# Patient Record
Sex: Female | Born: 1975 | Race: White | Hispanic: No | Marital: Single | Smoking: Current some day smoker
Health system: Southern US, Community
[De-identification: ages and names within clinical notes are randomized; demographics above are authoritative.]

## PROBLEM LIST (undated history)

## (undated) DIAGNOSIS — J329 Chronic sinusitis, unspecified: Secondary | ICD-10-CM

## (undated) DIAGNOSIS — Z91040 Latex allergy status: Secondary | ICD-10-CM

## (undated) DIAGNOSIS — K449 Diaphragmatic hernia without obstruction or gangrene: Secondary | ICD-10-CM

## (undated) DIAGNOSIS — L259 Unspecified contact dermatitis, unspecified cause: Secondary | ICD-10-CM

## (undated) DIAGNOSIS — K5000 Crohn's disease of small intestine without complications: Secondary | ICD-10-CM

## (undated) DIAGNOSIS — E119 Type 2 diabetes mellitus without complications: Secondary | ICD-10-CM

## (undated) DIAGNOSIS — E669 Obesity, unspecified: Secondary | ICD-10-CM

## (undated) DIAGNOSIS — F17200 Nicotine dependence, unspecified, uncomplicated: Secondary | ICD-10-CM

## (undated) DIAGNOSIS — N83209 Unspecified ovarian cyst, unspecified side: Secondary | ICD-10-CM

## (undated) HISTORY — DX: Nicotine dependence, unspecified, uncomplicated: F17.200

## (undated) HISTORY — PX: TONSILLECTOMY AND ADENOIDECTOMY: SUR1326

## (undated) HISTORY — DX: Diaphragmatic hernia without obstruction or gangrene: K44.9

## (undated) HISTORY — DX: Crohn's disease of small intestine without complications: K50.00

## (undated) HISTORY — DX: Chronic sinusitis, unspecified: J32.9

## (undated) HISTORY — PX: NASAL SEPTUM SURGERY: SHX37

## (undated) HISTORY — DX: Unspecified ovarian cyst, unspecified side: N83.209

## (undated) HISTORY — DX: Latex allergy status: Z91.040

## (undated) HISTORY — DX: Obesity, unspecified: E66.9

## (undated) HISTORY — DX: Type 2 diabetes mellitus without complications: E11.9

## (undated) HISTORY — DX: Unspecified contact dermatitis, unspecified cause: L25.9

---

## 1998-09-22 ENCOUNTER — Emergency Department (HOSPITAL_COMMUNITY): Admission: EM | Admit: 1998-09-22 | Discharge: 1998-09-22 | Payer: Self-pay | Admitting: Emergency Medicine

## 1999-01-11 ENCOUNTER — Emergency Department (HOSPITAL_COMMUNITY): Admission: EM | Admit: 1999-01-11 | Discharge: 1999-01-11 | Payer: Self-pay | Admitting: *Deleted

## 1999-01-16 ENCOUNTER — Encounter: Payer: Self-pay | Admitting: Specialist

## 1999-01-16 ENCOUNTER — Ambulatory Visit (HOSPITAL_COMMUNITY): Admission: RE | Admit: 1999-01-16 | Discharge: 1999-01-16 | Payer: Self-pay | Admitting: Specialist

## 2010-06-12 ENCOUNTER — Emergency Department (HOSPITAL_COMMUNITY)
Admission: EM | Admit: 2010-06-12 | Discharge: 2010-06-13 | Disposition: A | Discharge status: Home / Self Care | Payer: Self-pay | Attending: Emergency Medicine | Admitting: Emergency Medicine

## 2010-06-12 DIAGNOSIS — E119 Type 2 diabetes mellitus without complications: Secondary | ICD-10-CM | POA: Insufficient documentation

## 2010-06-12 DIAGNOSIS — R51 Headache: Secondary | ICD-10-CM | POA: Insufficient documentation

## 2010-06-12 DIAGNOSIS — K5090 Crohn's disease, unspecified, without complications: Secondary | ICD-10-CM | POA: Insufficient documentation

## 2010-06-13 LAB — URINALYSIS, ROUTINE W REFLEX MICROSCOPIC
Bilirubin Urine: NEGATIVE
Ketones, ur: NEGATIVE mg/dL
Nitrite: NEGATIVE
Protein, ur: NEGATIVE mg/dL
Specific Gravity, Urine: 1.039 — ABNORMAL HIGH (ref 1.005–1.030)
Urine Glucose, Fasting: >1000 mg/dL — AB
Urobilinogen, UA: 0.2 mg/dL (ref 0.0–1.0)
pH: 5.5 (ref 5.0–8.0)

## 2010-06-13 LAB — URINE MICROSCOPIC-ADD ON

## 2010-06-13 LAB — BASIC METABOLIC PANEL
BUN: 12 mg/dL (ref 6–23)
Creatinine, Ser: 0.89 mg/dL (ref 0.4–1.2)
GFR calc non Af Amer: >60 mL/min (ref >60)
Glucose, Bld: 472 mg/dL — ABNORMAL HIGH (ref 70–99)
Potassium: 4.1 mEq/L (ref 3.5–5.1)

## 2010-06-13 LAB — POCT PREGNANCY, URINE: Preg Test, Ur: NEGATIVE

## 2010-06-13 LAB — DIFFERENTIAL
Basophils Absolute: 0.1 K/uL (ref 0.0–0.1)
Basophils Relative: 1 % (ref 0–1)
Eosinophils Absolute: 0.2 K/uL (ref 0.0–0.7)
Eosinophils Relative: 2 % (ref 0–5)
Lymphocytes Relative: 43 % (ref 12–46)
Lymphs Abs: 4.2 K/uL — ABNORMAL HIGH (ref 0.7–4.0)
Monocytes Absolute: 0.4 K/uL (ref 0.1–1.0)
Monocytes Relative: 4 % (ref 3–12)
Neutro Abs: 5 K/uL (ref 1.7–7.7)
Neutrophils Relative %: 51 % (ref 43–77)

## 2010-06-13 LAB — CBC
HCT: 39.4 % (ref 36.0–46.0)
MCH: 31 pg (ref 26.0–34.0)
MCV: 87.4 fL (ref 78.0–100.0)
RDW: 12.3 % (ref 11.5–15.5)
WBC: 9.8 10*3/uL (ref 4.0–10.5)

## 2010-06-13 LAB — GLUCOSE, CAPILLARY: Glucose-Capillary: 530 mg/dL — ABNORMAL HIGH (ref 70–99)

## 2010-06-14 ENCOUNTER — Institutional Professional Consult (permissible substitution) (INDEPENDENT_AMBULATORY_CARE_PROVIDER_SITE_OTHER): Payer: Self-pay | Admitting: Pulmonary Disease

## 2010-06-14 ENCOUNTER — Other Ambulatory Visit: Payer: Self-pay

## 2010-06-14 ENCOUNTER — Ambulatory Visit: Payer: Self-pay | Admitting: Pulmonary Disease

## 2010-06-14 ENCOUNTER — Other Ambulatory Visit: Payer: Self-pay | Admitting: Pulmonary Disease

## 2010-06-14 ENCOUNTER — Encounter: Payer: Self-pay | Admitting: Pulmonary Disease

## 2010-06-14 DIAGNOSIS — E1165 Type 2 diabetes mellitus with hyperglycemia: Secondary | ICD-10-CM | POA: Insufficient documentation

## 2010-06-14 DIAGNOSIS — Z91040 Latex allergy status: Secondary | ICD-10-CM | POA: Insufficient documentation

## 2010-06-14 DIAGNOSIS — F17200 Nicotine dependence, unspecified, uncomplicated: Secondary | ICD-10-CM

## 2010-06-14 DIAGNOSIS — E669 Obesity, unspecified: Secondary | ICD-10-CM

## 2010-06-14 DIAGNOSIS — K5000 Crohn's disease of small intestine without complications: Secondary | ICD-10-CM | POA: Insufficient documentation

## 2010-06-14 DIAGNOSIS — T7840XA Allergy, unspecified, initial encounter: Secondary | ICD-10-CM

## 2010-06-14 DIAGNOSIS — E119 Type 2 diabetes mellitus without complications: Secondary | ICD-10-CM

## 2010-06-14 DIAGNOSIS — L259 Unspecified contact dermatitis, unspecified cause: Secondary | ICD-10-CM | POA: Insufficient documentation

## 2010-06-15 ENCOUNTER — Telehealth: Payer: Self-pay | Admitting: Pulmonary Disease

## 2010-06-17 ENCOUNTER — Encounter: Payer: Self-pay | Attending: Pulmonary Disease | Admitting: *Deleted

## 2010-06-17 ENCOUNTER — Telehealth: Payer: Self-pay | Admitting: Pulmonary Disease

## 2010-06-17 ENCOUNTER — Encounter: Payer: Self-pay | Admitting: Pulmonary Disease

## 2010-06-17 DIAGNOSIS — E119 Type 2 diabetes mellitus without complications: Secondary | ICD-10-CM | POA: Insufficient documentation

## 2010-06-17 DIAGNOSIS — Z713 Dietary counseling and surveillance: Secondary | ICD-10-CM | POA: Insufficient documentation

## 2010-06-20 DIAGNOSIS — K449 Diaphragmatic hernia without obstruction or gangrene: Secondary | ICD-10-CM | POA: Insufficient documentation

## 2010-06-20 DIAGNOSIS — N83209 Unspecified ovarian cyst, unspecified side: Secondary | ICD-10-CM | POA: Insufficient documentation

## 2010-06-21 NOTE — Progress Notes (Signed)
Summary: would like lab resutts  Phone Note Call from Patient Call back at Home Phone [phone]   Caller: Patient Call For: [name] Summary of Call: Patient phoned stated that she was seen yesterday and she had labs she would like the results. She can be reached at [phone]. If her mother Adrienne Goodwin answers the phone you can leave the results with her. Initial call taken by: Vedia Coffer,  June 15, 2010 1:27 PM  Follow-up for Phone Call        called and spoke with pt--Adrienne Goodwin--she is aware that SN is out of the office today but will be back in the am--will print off labs and place on SN cart for review---pt is aware that i will call her tomorrow with these results Randell Loop [facility]  June 15, 2010 2:17 PM   Additional Follow-up for Phone Call Additional follow up Details #1::        momtcb for pt---SN called and reviewed lab results ---a1c was 9.9  not as bad as he thought---take the metformin as directed and glimepiride as directed and get on a good low carb diet and work on her weight and she may be able to decrease these meds some.  lmomtcb for pt Randell Loop [facility]  June 15, 2010 2:58 PM     Additional Follow-up for Phone Call Additional follow up Details #2::    pt returned my call---she is aware per SN that  she needs the low carb diet and get her weight down and to take the meds are prescribed---pt voiced her understanding of this and will call for further problems or questions Randell Loop [facility]  June 15, 2010 5:27 PM

## 2010-06-21 NOTE — Progress Notes (Signed)
Summary: would like for Leigh to call her back re:lab work  Phone Note Call from Patient   Caller: Patient Call For: Remona Boom Summary of Call: Patient phoned would like for Leigh to call her back regarding her blood work that was drawn on Tuesday. Patient can be reached at [phone] Initial call taken by: Vedia Coffer,  June 17, 2010 1:11 PM  Follow-up for Phone Call        pt calling asking if her labs had been faxed to her eye doctor.  pt is  requesting that labs be faxed to [facility] fax #--[phone]. labs have been faxed to pts eye doctor per her request Randell Loop [facility]  June 17, 2010 3:02 PM

## 2010-06-30 NOTE — Assessment & Plan Note (Signed)
Summary: elevated BS/ per TD move to 2pm//jrc   CC:  Re-establish medical care....  History of Present Illness: 35 y/o WF, daughter of Deshauna Cayson, here to re-establish medical care after recent ER visit for blurry vision w/ dx of DM established w/ BS= 457... she was last seen in the [facility] office in the 1990s by DrPatterson for Crohn's ileitis & by DrPlotnikov for general medical care... she stopped all medical follow up & gained weight up to 220# range until recently noted polyuria & polydipsia w/ blurred vision that lead her to her eye doctor & fingerstick BS> 400... she went to the [facility] ER 2/19 where BS=472, neg urine ketones, & she was started on GLIMEPIRIDE 2mg  Qam & told to f/u here...  Current Problems:   Hx of SINUSITIS (ICD-473.9) - s/p deviated septum surg 1991 by DrLawrence... she has prev allergy testing by DrESL in 1994/09/09 w/ +reactions to mold & dust...  CIGARETTE SMOKER (ICD-305.1) - we discussed smoking cessation strategies  DIABETES MELLITUS (ICD-250.00) - new DX 2/12> started on DIET/ EXERCISE, GLIMEPIRIDE 2mg /d & METFORMIN 500mg Bid...  ~  labs 2/12 showed BS= 472, A1c= 9.9  OBESITY (ICD-278.00) - we disussed wt reduction 7 [facility] to [facility]...  ~  review of paper chart shows weights 180-200 in late 90's   ~  weight 2/12 = 219#  HIATAL HERNIA (ICD-553.3) - she had EGD by DrPatterson 1999 w/ sm HH, otherw neg...  ~  AbdSonar Sep 08, 1997 showed 2 sm GB polyps, no stones, no inflamm etc...  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 he felt that she prob had IBS and she was given trial Librax...  ~  CT Abd/ Pelvis in 1993-09-08 was neg x for complex right ovarian cyst (referred to DrSSmith).  ~  note: she had a neg colonoscopy 1995 by DrPerry...  Hx of OVARIAN CYST (ICD-620.2) - prev GYN eval by DrSigSmith  Hx of DERMATITIS (ICD-692.9) PERSONAL HISTORY OF  ALLERGY TO LATEX (ICD-V15.07) - sent to [facility] for eval in 1995/09/09.   Preventive Screening-Counseling & Management  Alcohol-Tobacco     Smoking Status: quit     Packs/Day: 0.5     Year Started: 2007     Year Quit: 09/08/08  Allergies (verified): 1)  ! * Latex  Comments:  Nurse/Medical Assistant: The patient's medications and allergies were reviewed with the patient and were updated in the Medication and Allergy Lists.  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 mother pat Ozga--hx of asthma/emphysema father passed away Sep 08, 2009 of MI 1 brother alive age 47  no PMH 1 sister alive age 79--FM, hip replacements and renal insuff  Social History: Reviewed history and no changes required. quit smoking 2010--smoked this time x 2 1/2  yrs   1/2 ppd single--lives with her mother pat Shepheard no children no alcohol use no exercise daily no caffeine use as of yesterday Smoking Status:  quit Packs/Day:  0.5  Review of Systems       The patient  complains of malaise, nasal congestion, dyspnea on exertion, cough, change in bowel habits, gas/bloating, urinary frequency, nocturia, stiffness, dryness, anxiety, polydipsia, polyuria, and hay fever.  The patient denies fever, chills, sweats, anorexia, fatigue, weakness, weight loss, sleep disorder, blurring, diplopia, eye irritation, eye discharge, vision loss, eye pain, photophobia, earache, ear discharge, tinnitus, decreased hearing, nosebleeds, sore throat, hoarseness, chest pain,  palpitations, syncope, orthopnea, PND, peripheral edema, dyspnea at rest, excessive sputum, hemoptysis, wheezing, pleurisy, nausea, vomiting, diarrhea, constipation, abdominal pain, melena, hematochezia, jaundice, indigestion/heartburn, dysphagia, odynophagia, dysuria, hematuria, urinary hesitancy, back pain, joint pain, joint swelling, muscle cramps, muscle weakness, arthritis, sciatica, restless legs, leg pain at night, leg pain with exertion, rash, itching, suspicious lesions, paralysis, paresthesias, seizures, tremors, vertigo, transient blindness, frequent falls, frequent headaches, difficulty walking, depression, memory loss, confusion, cold intolerance, heat intolerance, polyphagia, unusual weight change, abnormal bruising, bleeding, enlarged lymph nodes, urticaria, allergic rash, and recurrent infections.    Vital Signs:  Patient profile:   35 year old female Height:      65 inches Weight:      219 pounds BMI:     36.58 O2 Sat:      97 % on Room air Temp:     98.9 degrees F oral Pulse rate:   97 / minute BP sitting:   116 / 84  (left arm) Cuff size:   regular  Vitals Entered By: Randell Loop CMA (June 14, 2010 1:57 PM)  O2 Sat at Rest %:  97 O2 Flow:  Room air CC: Re-establish medical care... Is Patient Diabetic? Yes Pain Assessment Patient in pain? no      Comments meds updated today with pt   Physical Exam  Additional Exam:  WD, Obese, 35 y/o WF in NAD... she is somewhat anxious... GENERAL:  Alert & oriented; pleasant & cooperative... HEENT:  [place]/AT, EOM-wnl, PERRLA, Fundi-benign, EACs-clear, TMs-wnl, NOSE-clear, THROAT-clear & wnl. NECK:  Supple w/ full ROM; no JVD; normal carotid impulses w/o bruits; no thyromegaly or nodules palpated; no lymphadenopathy. CHEST:  Clear to P & A; without wheezes/ rales/ or rhonchi. HEART:  Regular Rhythm; without murmurs/ rubs/ or gallops. ABDOMEN:  Soft & nontender; normal bowel sounds; no organomegaly or masses detected. EXT:  without deformities or arthritic changes; no varicose veins/ venous insuffic/ or edema. NEURO:  CN's intact; motor testing normal; sensory testing normal; gait normal & balance OK. DERM:  No lesions noted; no rash etc...    MISC. Report  Procedure date:  06/14/2010  Findings:      We reviewed her ER visit note & labs 06/12/10... A1c= 9.9 today...   Impression & Recommendations:  Problem # 1:  DIABETES MELLITUS (ICD-250.00) we discussed DM & it's causes & treatment... REC> start METFORMIN 500mg  Bid & continue Glimep 2mg  Qam... [facility] to [facility]... we discussed diet + exercise... Accucheck machine & strips for home monitoring... Short term f/u to check progress...  Her updated medication list for this problem includes:    Metformin Hcl 500 Mg Tabs (Metformin hcl) .Marland Kitchen... Take 1 tab by mouth two times a day...    Glimepiride 2 Mg Tabs (Glimepiride) .Marland Kitchen... Take 1 tab by mouth once daily in am...  Orders: Nutrition Referral (Nutrition) TLB-A1C / Hgb A1C (Glycohemoglobin) (83036-A1C)  Problem # 2:  CIGARETTE SMOKER (ICD-305.1) Must quit all smoking... discussed smoking cessation options...  Problem # 3:  OBESITY (ICD-278.00) Weight reduction is key...  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  Problem # 5:  Hx of DERMATITIS (ICD-692.9) she is allergic to Latex...  Problem # 6:  OTHER MEDICAL PROBLEMS AS NOTED>>>  Complete Medication List: 1)  Metformin Hcl 500 Mg Tabs (Metformin hcl) .... Take 1 tab by mouth two times a day... 2)  Glimepiride 2 Mg Tabs (Glimepiride) .... Take 1 tab by mouth once daily in am... 3)  Accu-chek Aviva Strp (Glucose blood) .... Check blood sugar daily as directed... 4)  Diflucan 100 Mg Tabs (Fluconazole) .... Take 2 tabs by mouth today, then 1 tab daily til gone...  Patient Instructions: 1)  Today we updated your med list- see below.... 2)  For your DIABETES:   Start a low carb diet> no sugars no sweets no pastas, no breads, & nothing made w/ white flour (pancakes, waffles, buscuits)...  start the METFORMIN one tab twice daily ay breakfast & dinner... continue the GLIMEPIRIDE given in the ER- one tab in AM..Marland Kitchen 3)  We will arrange for a nutrition consult at the [facility]... 4)  Start checking your BS at home> once daily & vary the times that you check (keep a log book of your results)... 5)  Drink plenty of water>  but that's all> no sodas, jucies, etc... 6)  Call for questions.Marland KitchenMarland Kitchen 7)  Please schedule a follow-up appointment in 1 month. Prescriptions: DIFLUCAN 100 MG TABS (FLUCONAZOLE) take 2 tabs by mouth today, then 1 tab daily til gone...  #5 x 1   Entered and Authorized by:   Michele Mcalpine MD   Signed by:   Michele Mcalpine MD on 06/14/2010   Method used:   Print then Give to Patient   RxID:   [id number] ACCU-CHEK AVIVA  STRP (GLUCOSE BLOOD) check blood sugar daily as directed...  #30 x 12   Entered and Authorized by:   Michele Mcalpine MD   Signed by:   Michele Mcalpine MD on 06/14/2010   Method used:   Print then Give to Patient   RxID:   [id number] GLIMEPIRIDE 2 MG TABS (GLIMEPIRIDE) take 1 tab by mouth once daily in am...  #30 x 12   Entered and Authorized by:   Michele Mcalpine MD   Signed by:   Michele Mcalpine MD on 06/14/2010   Method used:   Print then Give to Patient   RxID:   [id number] METFORMIN HCL 500 MG TABS (METFORMIN HCL) take 1 tab by mouth two times a day...  #60 x 12   Entered and Authorized by:   Michele Mcalpine MD   Signed by:   Michele Mcalpine MD on 06/14/2010   Method used:   Print then Give to Patient   RxID:   [id number]

## 2010-07-04 ENCOUNTER — Encounter: Payer: Self-pay | Admitting: Pulmonary Disease

## 2010-07-04 ENCOUNTER — Ambulatory Visit (INDEPENDENT_AMBULATORY_CARE_PROVIDER_SITE_OTHER): Payer: Self-pay | Admitting: Pulmonary Disease

## 2010-07-04 DIAGNOSIS — E119 Type 2 diabetes mellitus without complications: Secondary | ICD-10-CM

## 2010-07-04 DIAGNOSIS — J069 Acute upper respiratory infection, unspecified: Secondary | ICD-10-CM

## 2010-07-04 DIAGNOSIS — E669 Obesity, unspecified: Secondary | ICD-10-CM

## 2010-07-05 NOTE — Letter (Signed)
Summary: [facility]  [facility]   Imported By: Sherian Rein 06/28/2010 12:58:27  _____________________________________________________________________  External Attachment:    Type:   Image     Comment:   External Document

## 2010-07-11 ENCOUNTER — Ambulatory Visit: Payer: Self-pay | Admitting: Pulmonary Disease

## 2010-07-12 NOTE — Assessment & Plan Note (Signed)
Summary: follow up after starting DM meds/la   CC:  follow up after starting on DM meds/sugars have been good this last week.  History of Present Illness: 35 y/o WF, daughter of Jenilyn Magana, here to re-establish medical care after recent ER visit for blurry vision w/ dx of DM established w/ BS= 457... she was last [facility] in the [facility] office in the 1990s by DrPatterson for Crohn's ileitis & by DrPlotnikov for general medical care... she stopped all medical follow up & gained weight up to 220# range until recently noted polyuria & polydipsia w/ blurred vision that lead her to her eye doctor & fingerstick BS> 400... she went to the [facility] ER 2/19 where BS=472, neg urine ketones, & she was started on GLIMEPIRIDE 2mg  Qam & told to f/u here...   ~  July 04, 2010:  2 week ROV recheck> tolerating Metformin two times a day & Glimep 2mg Qam> BS at home= much improved w/ FBS in the 130-140 range (down from 250 range); she's been to the [facility] w/ diet education but so far wt up 1-2#...    She is c/o some head & chest congestion w/ coupy cough, some CP from coughing but no F C S, no sputum, denies SOB etc> rec to take Mucinex 1-2 Bid w/ plenty of water, Rx Hydromet cough syrup, & ZPak for Prn use...    Current Problems:   Hx of SINUSITIS (ICD-473.9) - s/p deviated septum surg 1991 by DrLawrence... she has prev allergy testing by DrESL in 1996 w/ +reactions to mold & dust...  CIGARETTE SMOKER (ICD-305.1) - she quit smoking in 2011 when her father had MI...  DIABETES MELLITUS (ICD-250.00) - new DX 2/12> started on DIET/ EXERCISE, GLIMEPIRIDE 2mg /d & METFORMIN 500mg Bid...  ~  labs 2/12 showed BS= 472, A1c= 9.9  OBESITY (ICD-278.00) - we disussed wt reduction & referral to [facility]...  ~  review of paper chart shows weights 180-200 in late 90's   ~  weight 2/12 = 219# ==> 2 wk f/u recheck wt=221# & we reviewed the diet recommendations.  HIATAL HERNIA (ICD-553.3) - she had EGD by DrPatterson  1999 w/ sm HH, otherw neg...  ~  AbdSonar 1999 showed 2 sm GB polyps, no stones, no inflamm etc...  ?Hx of CROHN'S DISEASE, SMALL INTESTINE (ICD-555.0) - followed by DrPatterson & last colonoscopy was 1999 showing normal terminal ileum w/o signs of inflamm bowel dis... he felt that she prob had IBS and she was given trial Librax...  ~  CT Abd/ Pelvis in 1995 was neg x for complex right ovarian cyst (referred to DrSSmith).  ~  note: she had a neg colonoscopy 1995 by DrPerry...  Hx of OVARIAN CYST (ICD-620.2) - prev GYN eval by DrSigSmith  Hx of DERMATITIS (ICD-692.9) PERSONAL HISTORY OF ALLERGY TO LATEX (ICD-V15.07) - sent to [facility] for eval in 1997.   Preventive Screening-Counseling & Management  Alcohol-Tobacco     Smoking Status: quit     Packs/Day: 0.5     Year Started: 2007     Year Quit: 2010  Allergies: 1)  ! * Latex  Comments:  Nurse/Medical Assistant: The patient's medications and allergies were reviewed with the patient and were updated in the Medication and Allergy Lists.  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 mother pat Merrick--hx of asthma/emphysema father passed away 09-24-2009 of MI 1 brother alive age 6  no PMH 1 sister alive age 81--FM, hip replacements and renal insuff  Social History: Reviewed history from 06/14/2010 and no changes required. quit smoking 2010--smoked this time x 2 1/2  yrs   1/2 ppd single--lives with her mother pat Wyles no children no alcohol use no  exercise daily no caffeine use as of yesterday  Review of Systems      See HPI       The patient complains of hoarseness and prolonged cough.  The patient denies anorexia, fever, weight loss, weight gain, vision loss, decreased hearing, chest pain, syncope, dyspnea on exertion, peripheral edema, headaches, hemoptysis, abdominal pain, melena, hematochezia, severe indigestion/heartburn, hematuria, incontinence, muscle weakness, suspicious skin lesions, transient blindness, difficulty walking, depression, unusual weight change, abnormal bleeding, enlarged lymph nodes, and angioedema.    Vital Signs:  Patient profile:   35 year old female Height:      65 inches Weight:      220.50 pounds BMI:     36.83 O2 Sat:      97 % on Room air Temp:     97.7 degrees F oral Pulse rate:   112 / minute BP sitting:   112 / 70  (right arm) Cuff size:   large  Vitals Entered By: Randell Loop CMA (July 04, 2010 8:57 AM)  O2 Sat at Rest %:  97 O2 Flow:  Room air CC: follow up after starting on DM meds/sugars have been good this last week Is Patient Diabetic? Yes Pain Assessment Patient in pain? no      Comments meds updated today with pt   Physical Exam  Additional Exam:  WD, Obese, 35 y/o WF in NAD... she is somewhat anxious... GENERAL:  Alert & oriented; pleasant & cooperative... HEENT:  [place]/AT, EOM-wnl, PERRLA, Fundi-benign, EACs-clear, TMs-wnl, NOSE-clear, THROAT-clear & wnl. NECK:  Supple w/ full ROM; no JVD; normal carotid impulses w/o bruits; no thyromegaly or nodules palpated; no lymphadenopathy. CHEST:  Croupy cough but chest clear to P & A; without wheezes/ rales/ or rhonchi heard... HEART:  Regular Rhythm; without murmurs/ rubs/ or gallops. ABDOMEN:  Soft & nontender; normal bowel sounds; no organomegaly or masses detected. EXT: without deformities or arthritic changes; no varicose veins/ venous insuffic/ or edema. NEURO:  CN's intact; motor testing normal; sensory testing normal; gait  normal & balance OK. DERM:  No lesions noted; no rash etc...    Impression & Recommendations:  Problem # 1:  UPPER RESPIRATORY INFECTION (ICD-465.9) We discussed Rx w/ Mucinex, Fluids, Hydromet, ZPak... Her updated medication list for this problem includes:    Mucinex 600 Mg Xr12h-tab (Guaifenesin) .Marland Kitchen... Take 2 tabs by mouth two times a day w/ plenty of water...    Hydromet 5-1.5 Mg/21ml Syrp (Hydrocodone-homatropine) .Marland Kitchen... 1 tsp by mouth every 4-6 h as needed for cough...  Problem # 2:  DIABETES MELLITUS (ICD-250.00) Much improved on these 2 meds w/ FBS down to the 130-140 range... continue same. Her updated medication list for this problem includes:    Metformin Hcl 500 Mg Tabs (Metformin hcl) .Marland Kitchen... Take 1 tab by mouth two times a day...    Glimepiride 2 Mg Tabs (Glimepiride) .Marland Kitchen... Take 1 tab by  mouth once daily in am...  Problem # 3:  OBESITY (ICD-278.00) [facility] but hasn't lost any wt yet> we discussed diet/ exercise/ wt reduction...  Complete Medication List: 1)  Metformin Hcl 500 Mg Tabs (Metformin hcl) .... Take 1 tab by mouth two times a day... 2)  Glimepiride 2 Mg Tabs (Glimepiride) .... Take 1 tab by mouth once daily in am... 3)  Accu-chek Aviva Strp (Glucose blood) .... Check blood sugar daily as directed... 4)  Mucinex 600 Mg Xr12h-tab (Guaifenesin) .... Take 2 tabs by mouth two times a day w/ plenty of water... 5)  Hydromet 5-1.5 Mg/62ml Syrp (Hydrocodone-homatropine) .Marland Kitchen.. 1 tsp by mouth every 4-6 h as needed for cough... 6)  Zithromax Z-pak 250 Mg Tabs (Azithromycin) .... Take as directed for infection...  Patient Instructions: 1)  Today we updated your med list- see below.... 2)  We wrote nrew perscriptions for a cough syrup to use as needed & a ZPak for infection... use the OTC MUCINEX or MucinexDM 2 tabs twice daily w/ lots of fluids.Marland KitchenMarland Kitchen 3)  Continue the Metformin & Glimepiride the same... 4)  Let's get on track w/ our diet & exercise program> if  you are following the instructions your weight will be coming down... 5)  Please schedule a follow-up appointment in 1 month, & we will recheck your A1c at that time... Prescriptions: ZITHROMAX Z-PAK 250 MG TABS (AZITHROMYCIN) take as directed for infection...  #1 pack x 2   Entered and Authorized by:   Michele Mcalpine MD   Signed by:   Michele Mcalpine MD on 07/04/2010   Method used:   Print then Give to Patient   RxID:   [id number] HYDROMET 5-1.5 MG/5ML SYRP (HYDROCODONE-HOMATROPINE) 1 tsp by mouth every 4-6 H as needed for cough...  #8 oz x 2   Entered and Authorized by:   Michele Mcalpine MD   Signed by:   Michele Mcalpine MD on 07/04/2010   Method used:   Print then Give to Patient   RxID:   [id number]    Immunization History:  Influenza Immunization History:    Influenza:  never takes (07/04/2010)

## 2010-08-12 ENCOUNTER — Encounter: Payer: Self-pay | Admitting: Pulmonary Disease

## 2010-08-16 ENCOUNTER — Encounter: Payer: Self-pay | Admitting: Pulmonary Disease

## 2010-08-16 ENCOUNTER — Ambulatory Visit (INDEPENDENT_AMBULATORY_CARE_PROVIDER_SITE_OTHER): Payer: Self-pay | Admitting: Pulmonary Disease

## 2010-08-16 DIAGNOSIS — E669 Obesity, unspecified: Secondary | ICD-10-CM

## 2010-08-16 DIAGNOSIS — K5000 Crohn's disease of small intestine without complications: Secondary | ICD-10-CM

## 2010-08-16 DIAGNOSIS — F17200 Nicotine dependence, unspecified, uncomplicated: Secondary | ICD-10-CM

## 2010-08-16 DIAGNOSIS — E119 Type 2 diabetes mellitus without complications: Secondary | ICD-10-CM

## 2010-08-16 MED ORDER — HYDROCODONE-HOMATROPINE 5-1.5 MG/5ML PO SYRP: 5.0000 mL | ORAL_SOLUTION | Freq: Four times a day (QID) | ORAL | Status: DC | PRN

## 2010-08-16 NOTE — Patient Instructions (Signed)
Good job overall on your diet & exercise program...    The goal is to lose 1 lb per week...  Continue your same medications for now & call for any problems...    Let's plan a morning appt in 6-8 weeks w/ FASTING blood work at that time.Marland KitchenMarland Kitchen

## 2010-08-16 NOTE — Progress Notes (Signed)
Subjective:    Patient ID: Adrienne Goodwin, female    DOB: Jun 09, 1975, 35 y.o.   MRN: [id number]  HPI 60 y/o WF, daughter of Adrienne Goodwin, followed for general medical purposes w/ problems as noted below>  ~  June 14, 2010:  here to re-establish medical care after recent ER visit for blurry vision w/ dx of DM established w/ BS= 457... she was last seen in the [place] office in the 1990s by Adrienne Goodwin for Crohn's ileitis & by Adrienne Goodwin for general medical care... she stopped all medical follow up & gained weight up to 220# range until recently noted polyuria & polydipsia w/ blurred vision that lead her to her eye doctor & fingerstick BS> 400... she went to the [facility] ER 2/19 where BS=472, neg urine ketones, & she was started on GLIMEPIRIDE 2mg  Qam & told to f/u here...  ~  July 04, 2010:  3 week ROV recheck> tolerating Metformin two times a day & Glimep 2mg Qam> BS at home= much improved w/ FBS in the 130-140 range (down from 250 range); she's been to the [facility] w/ diet education but so far wt up 1-2#...    She is c/o some head & chest congestion w/ coupy cough, some CP from coughing but no F C S, no sputum, denies SOB etc> rec to take Mucinex 1-2 Bid w/ plenty of water, Rx Hydromet cough syrup, & ZPak for Prn use...  ~  August 16, 2010:  6wk ROV & doing much better> on diet but wt essent unchanged & not really exercising (states she's losing inches, not lbs);  We reviewed diet + exercise prescription & goal of 1 lb wt reduction per week;  Notes FBS in the 110-125 range now, had one hypoglycemic event at 4pm one day w/ BS 37 (we discussed staying away from sweets, use peanut butter or nabs);  We discussed wt watchers, going w/ a buddy, etc...  Plan ROV 6 wks w/ fasting blood work...         Problem List:    Hx of SINUSITIS (ICD-473.9) - s/p deviated septum surg 1991 by Adrienne Goodwin... she has prev allergy testing by Adrienne Goodwin in 1996 w/ +reactions to mold & dust...  CIGARETTE SMOKER  (ICD-305.1) - she quit smoking in 2011 when her father had MI...  DIABETES MELLITUS (ICD-250.00) - new DX 2/12> started on DIET/ EXERCISE, GLIMEPIRIDE 2mg /d & METFORMIN 500mg Bid... ~  labs 2/12 showed BS= 472, A1c= 9.9 ~  4/12:  She notes FBS down to 110-125 range, but hasn't lost any wt & we discussed this...  OBESITY (ICD-278.00) - we disussed wt reduction & referral to [facility]... ~  review of paper chart shows weights 180-200 in late 90's  ~  weight 2/12 = 219# ==> 2 wk f/u recheck wt=221# & we reviewed the diet recommendations. ~  Weight 4/12 = 221# but she notes "I'm losing inches"... Discussed goal of losing 1 lb every week.  HIATAL HERNIA (ICD-553.3) - she had EGD by Adrienne Goodwin 1999 w/ sm HH, otherw neg... ~  AbdSonar 1999 showed 2 sm GB polyps, no stones, no inflamm etc...  ?Hx of CROHN'S DISEASE, SMALL INTESTINE (ICD-555.0) - followed by Adrienne Goodwin & last colonoscopy was 1999 showing normal terminal ileum w/o signs of inflamm bowel dis... he felt that she prob had IBS and she was given trial Librax... ~  CT Abd/ Pelvis in 1995 was neg x for complex right ovarian cyst (referred to Adrienne Goodwin). ~  note: she had a neg  colonoscopy 1995 by Adrienne Goodwin...  Hx of OVARIAN CYST (ICD-620.2) - prev GYN eval by Adrienne Goodwin  Hx of DERMATITIS (ICD-692.9) PERSONAL HISTORY OF ALLERGY TO LATEX (ICD-V15.07) - sent to [facility] for eval in 1997.   Past Surgical History  Procedure Date  . Tonsillectomy and adenoidectomy   . Nasal septum surgery     Outpatient Encounter Prescriptions as of 08/16/2010  Medication Sig Dispense Refill  . glimepiride (AMARYL) 2 MG tablet Take 2 mg by mouth daily before breakfast.        . glucose blood test strip 1 each by Other route as needed. Use as instructed       . guaiFENesin (MUCINEX) 600 MG 12 hr tablet Take 1,200 mg by mouth 2 (two) times daily.        Marland Kitchen HYDROcodone-homatropine (HYCODAN) 5-1.5 MG/5ML syrup Take 5 mLs by mouth every 6 (six) hours as  needed.        . metFORMIN (GLUCOPHAGE) 500 MG tablet Take 500 mg by mouth 2 (two) times daily with a meal.          Allergies  Allergen Reactions  . Latex     REACTION: rash    Review of Systems         See HPI - all other systems neg except as noted... The patient complains of hoarseness and prolonged cough.  The patient denies anorexia, fever, weight loss, weight gain, vision loss, decreased hearing, chest pain, syncope, dyspnea on exertion, peripheral edema, headaches, hemoptysis, abdominal pain, melena, hematochezia, severe indigestion/heartburn, hematuria, incontinence, muscle weakness, suspicious skin lesions, transient blindness, difficulty walking, depression, unusual weight change, abnormal bleeding, enlarged lymph nodes, and angioedema.     Objective:   Physical Exam     WD, Obese, 35 y/o WF in NAD... she is somewhat anxious... GENERAL:  Alert & oriented; pleasant & cooperative... HEENT:  /AT, EOM-wnl, PERRLA, Fundi-benign, EACs-clear, TMs-wnl, NOSE-clear, THROAT-clear & wnl. NECK:  Supple w/ full ROM; no JVD; normal carotid impulses w/o bruits; no thyromegaly or nodules palpated; no lymphadenopathy. CHEST:  Croupy cough but chest clear to P & A; without wheezes/ rales/ or rhonchi heard... HEART:  Regular Rhythm; without murmurs/ rubs/ or gallops. ABDOMEN:  Soft & nontender; normal bowel sounds; no organomegaly or masses detected. EXT: without deformities or arthritic changes; no varicose veins/ venous insuffic/ or edema. NEURO:  CN's intact; motor testing normal; sensory testing normal; gait normal & balance OK. DERM:  No lesions noted; no rash etc...   Assessment & Plan:   DM>  She is improved w/ FBS in the 110-125 range now but hasn't lost any wt & this is crucial to controlling her DM;  We discussed diet + EXERCISE & the goal of losing 1 lb per week;  She will ret in 6 weeks w/ FASTING blood work at that time...  Ex-smoker>  She has remained quit & congratulated on  this accomplishment...  HH>  She denies GERD, reflux, dysphagia, etc...  Crohn's>  Stable w/o abd pain, N V D or blood seen...  Other medical issues as noted.Marland KitchenMarland Kitchen

## 2010-08-28 ENCOUNTER — Encounter: Payer: Self-pay | Admitting: Pulmonary Disease

## 2010-08-31 ENCOUNTER — Other Ambulatory Visit: Payer: Self-pay | Admitting: Pulmonary Disease

## 2010-08-31 ENCOUNTER — Telehealth: Payer: Self-pay | Admitting: Pulmonary Disease

## 2010-08-31 MED ORDER — HYDROCODONE-HOMATROPINE 5-1.5 MG/5ML PO SYRP: 5.0000 mL | ORAL_SOLUTION | Freq: Four times a day (QID) | ORAL | Status: DC | PRN

## 2010-08-31 NOTE — Telephone Encounter (Signed)
Spoke with pt and notified okay to refill hycodan.  Rx was called to pharm.

## 2010-08-31 NOTE — Telephone Encounter (Signed)
Adrienne Goodwin- aka Adrienne Goodwin- called back to state that the "correct pharmacy" that she uses is [facility] on [address]. [place] Ringer

## 2010-08-31 NOTE — Telephone Encounter (Signed)
Pt requesting refill for hycodan syrup. She says her cough is back and non-prod. She had only been off the cough med x 2 days. She denies any sinus drainage or allergy symptoms. Pls advise.

## 2010-08-31 NOTE — Telephone Encounter (Signed)
Ok  For 1 refill of the cough meds.  thanks

## 2010-10-12 ENCOUNTER — Ambulatory Visit (INDEPENDENT_AMBULATORY_CARE_PROVIDER_SITE_OTHER)
Admission: RE | Admit: 2010-10-12 | Discharge: 2010-10-12 | Disposition: A | Discharge status: Home / Self Care | Payer: Self-pay | Source: Ambulatory Visit | Attending: Pulmonary Disease | Admitting: Pulmonary Disease

## 2010-10-12 ENCOUNTER — Telehealth: Payer: Self-pay | Admitting: Pulmonary Disease

## 2010-10-12 ENCOUNTER — Other Ambulatory Visit (INDEPENDENT_AMBULATORY_CARE_PROVIDER_SITE_OTHER): Payer: Self-pay

## 2010-10-12 ENCOUNTER — Ambulatory Visit (INDEPENDENT_AMBULATORY_CARE_PROVIDER_SITE_OTHER): Payer: Self-pay | Admitting: Pulmonary Disease

## 2010-10-12 DIAGNOSIS — K5000 Crohn's disease of small intestine without complications: Secondary | ICD-10-CM

## 2010-10-12 DIAGNOSIS — F17200 Nicotine dependence, unspecified, uncomplicated: Secondary | ICD-10-CM

## 2010-10-12 DIAGNOSIS — E119 Type 2 diabetes mellitus without complications: Secondary | ICD-10-CM

## 2010-10-12 DIAGNOSIS — L259 Unspecified contact dermatitis, unspecified cause: Secondary | ICD-10-CM

## 2010-10-12 DIAGNOSIS — K449 Diaphragmatic hernia without obstruction or gangrene: Secondary | ICD-10-CM

## 2010-10-12 DIAGNOSIS — E669 Obesity, unspecified: Secondary | ICD-10-CM

## 2010-10-12 DIAGNOSIS — Z0000 Encounter for general adult medical examination without abnormal findings: Secondary | ICD-10-CM

## 2010-10-12 DIAGNOSIS — Z91040 Latex allergy status: Secondary | ICD-10-CM

## 2010-10-12 DIAGNOSIS — T7840XA Allergy, unspecified, initial encounter: Secondary | ICD-10-CM

## 2010-10-12 LAB — LIPID PANEL
HDL: 36.9 mg/dL — ABNORMAL LOW (ref >39.00)
Total CHOL/HDL Ratio: 7
Triglycerides: 289 mg/dL — ABNORMAL HIGH (ref 0.0–149.0)

## 2010-10-12 LAB — CBC WITH DIFFERENTIAL/PLATELET
Basophils Absolute: 0 10*3/uL (ref 0.0–0.1)
Eosinophils Absolute: 0.2 10*3/uL (ref 0.0–0.7)
HCT: 41.2 % (ref 36.0–46.0)
Hemoglobin: 14.4 g/dL (ref 12.0–15.0)
Lymphs Abs: 2.9 10*3/uL (ref 0.7–4.0)
MCHC: 34.9 g/dL (ref 30.0–36.0)
Neutro Abs: 5.1 10*3/uL (ref 1.4–7.7)
RDW: 12.9 % (ref 11.5–14.6)

## 2010-10-12 LAB — BASIC METABOLIC PANEL
CO2: 27 mEq/L (ref 19–32)
Calcium: 9.3 mg/dL (ref 8.4–10.5)
GFR: 78.79 mL/min (ref >60.00)
Sodium: 137 mEq/L (ref 135–145)

## 2010-10-12 LAB — HEPATIC FUNCTION PANEL
Alkaline Phosphatase: 61 U/L (ref 39–117)
Bilirubin, Direct: 0.1 mg/dL (ref 0.0–0.3)

## 2010-10-12 NOTE — Telephone Encounter (Signed)
Spoke with Leigh-states she told patient to call this afternoon for results-will show SN and call patient with lab results.

## 2010-10-12 NOTE — Patient Instructions (Signed)
Today we updated your med list in EPIC>     Let's get on track w/ our DIET & EXERCISE program...    The goal is to lose 1 measily pound per week> YOU CAN DO IT!  Today we did your follow up CXR, EKG, & fasting blood work...    Please call the PHONE TREE in a few days for your results...    Dial [id number] & when prompted enter your patient number followed by the # symbol...    Your patient number is:  [id number]  Call for any questions...  Let's plan another follow up visit to check your progress in 3 months.Marland KitchenMarland Kitchen

## 2010-10-12 NOTE — Progress Notes (Signed)
Subjective:    Patient ID: Adrienne Goodwin, female    DOB: 09-22-1975, 35 y.o.   MRN: [id number]  HPI  27 y/o WF, daughter of Adrienne Goodwin, followed for general medical purposes>>  ~  June 14, 2010:  here to re-establish medical care after recent ER visit for blurry vision w/ dx of DM established w/ BS= 457... she was last seen in the [place] office in the 1990s by DrPatterson for Crohn's ileitis & by DrPlotnikov for general medical care... she stopped all medical follow up & gained weight up to 220# range until recently noted polyuria & polydipsia w/ blurred vision that lead her to her eye doctor & fingerstick BS> 400... she went to the [facility] ER 2/19 where BS=472, neg urine ketones, & she was started on GLIMEPIRIDE 2mg  Qam & told to f/u here... We discussed Diet, Exercise, & added Metformin 500mg  Bid.  ~  July 04, 2010:  3 week ROV recheck> tolerating Metformin two times a day & Glimep 2mg Qam> BS at home= much improved w/ FBS in the 130-140 range (down from 250 range); she's been to the [facility] w/ diet education but so far wt up 1-2#...    She is c/o some head & chest congestion w/ coupy cough, some CP from coughing but no F C S, no sputum, denies SOB etc> rec to take Mucinex 1-2 Bid w/ plenty of water, Rx Hydromet cough syrup, & ZPak for Prn use...  ~  August 16, 2010:  6wk ROV & doing much better> on diet but wt essent unchanged & not really exercising (states she's losing inches, not lbs);  We reviewed diet + exercise prescription & goal of 1 lb wt reduction per week;  Notes FBS in the 110-125 range now, had one hypoglycemic event at 4pm one day w/ BS 37 (we discussed staying away from sweets, use peanut butter or nabs);  We discussed wt watchers, going w/ a buddy, etc...  Plan ROV 6 wks w/ fasting blood work...  ~  October 12, 2010:  33mo ROV & she is feeling better but has gained 4# to 226# today ("I watch what I eat"); we reviewed diet & exercise program ("I visit Mom in the hosp") once  again; BS at home are good in the 120 range w/o hypoglycemia> NOTE: she decr the Gluimepiride herself to 1/2 of the 2mg  tab each AM; labs today showed BS=126, A1c=6.3 (OK to leave Glimep at 1mg /d)...  We checked CXR (clear, NAD), EKG (NSR, minNSSTTWA), Labs (see below)...   Problem List:    Hx of SINUSITIS (ICD-473.9) - s/p deviated septum surg 1991 by DrLawrence... she has prev allergy testing by DrESL in 1996 w/ +reactions to mold & dust...  CIGARETTE SMOKER (ICD-305.1) - she quit smoking in 2011 when her father had MI... ~  CXR 6/12 showed clear lungs, NAD... ~  EKG 6/12 showed NSR, minor NSSTTWA...  HYPERLIPIDEMIA>  On Diet (low chol, low fat) to start, if FLP not trending better then we will start meds... ~  FLP 6/12 on diet alone showed TChol 241, TG 289, HDL 37, LDL 166... We decided on diet & exercise to start...  DIABETES MELLITUS (ICD-250.00) - new DX 2/12> started on DIET/ EXERCISE, GLIMEPIRIDE 2mg /d & METFORMIN 500mg Bid... ~  labs 2/12 showed BS= 472, A1c= 9.9 ~  4/12:  She notes FBS down to 110-125 range, but hasn't lost any wt & we discussed this... ~  Labs 6/12 showed BS= 126, A1c= 6.3, Umicroalb= neg.Marland KitchenMarland Kitchen  Great job, now get wt down.  OBESITY (ICD-278.00) - we disussed wt reduction & referral to [facility]... ~  review of paper chart shows weights 180-200 in late 90's  ~  weight 2/12 = 219# ==> 2 wk f/u recheck wt=221# & we reviewed the diet recommendations. ~  Weight 4/12 = 221# but she notes "I'm losing inches"... Discussed goal of losing 1 lb every week. ~  Weight 6/12 = 226# & we discussed need for a RWEAL diet & exercise program.  HIATAL HERNIA (ICD-553.3) - she had EGD by DrPatterson 1999 w/ sm HH, otherw neg... ~  AbdSonar 1999 showed 2 sm GB polyps, no stones, no inflamm etc...  ?Hx of CROHN'S DISEASE, SMALL INTESTINE (ICD-555.0) - followed by DrPatterson & last colonoscopy was 1999 showing normal terminal ileum w/o signs of inflamm bowel dis... he felt that she  prob had IBS and she was given trial Librax... ~  CT Abd/ Pelvis in 1995 was neg x for complex right ovarian cyst (referred to DrSSmith). ~  note: she had a neg colonoscopy 1995 by DrPerry...  BORDERLINE LFTs> likely FATTY LIVER DISEASE> we discussed this condition & the need to lose weight. ~  Labs 6/12 showed SGOT= 40, SGPT= 55  Hx of OVARIAN CYST (ICD-620.2) - prev GYN eval by DrSigSmith  Hx of DERMATITIS (ICD-692.9) PERSONAL HISTORY OF ALLERGY TO LATEX (ICD-V15.07) - sent to [facility] for eval in 1997.   Past Surgical History  Procedure Date  . Tonsillectomy and adenoidectomy   . Nasal septum surgery     Outpatient Encounter Prescriptions as of 10/12/2010  Medication Sig Dispense Refill  . glimepiride (AMARYL) 2 MG tablet Take 2 mg by mouth daily before breakfast.        . glucose blood test strip 1 each by Other route as needed. Use as instructed       . guaiFENesin (MUCINEX) 600 MG 12 hr tablet Take 1,200 mg by mouth 2 (two) times daily.        . metFORMIN (GLUCOPHAGE) 500 MG tablet Take 500 mg by mouth 2 (two) times daily with a meal.        . HYDROcodone-homatropine (HYCODAN) 5-1.5 MG/5ML syrup Take 5 mLs by mouth every 6 (six) hours as needed.  120 mL  0    Allergies  Allergen Reactions  . Latex     REACTION: rash    Review of Systems         See HPI - all other systems neg except as noted... The patient complains of hoarseness and prolonged cough.  The patient denies anorexia, fever, weight loss, weight gain, vision loss, decreased hearing, chest pain, syncope, dyspnea on exertion, peripheral edema, headaches, hemoptysis, abdominal pain, melena, hematochezia, severe indigestion/heartburn, hematuria, incontinence, muscle weakness, suspicious skin lesions, transient blindness, difficulty walking, depression, unusual weight change, abnormal bleeding, enlarged lymph nodes, and angioedema.     Objective:   Physical Exam     WD, Obese, 35 y/o WF in NAD... she is somewhat  anxious... GENERAL:  Alert & oriented; pleasant & cooperative... HEENT:  [place]/AT, EOM-wnl, PERRLA, Fundi-benign, EACs-clear, TMs-wnl, NOSE-clear, THROAT-clear & wnl. NECK:  Supple w/ full ROM; no JVD; normal carotid impulses w/o bruits; no thyromegaly or nodules palpated; no lymphadenopathy. CHEST:  Croupy cough but chest clear to P & A; without wheezes/ rales/ or rhonchi heard... HEART:  Regular Rhythm; without murmurs/ rubs/ or gallops. ABDOMEN:  Soft & nontender; normal bowel sounds; no organomegaly or masses detected. EXT: without  deformities or arthritic changes; no varicose veins/ venous insuffic/ or edema. NEURO:  CN's intact; motor testing normal; sensory testing normal; gait normal & balance OK. DERM:  No lesions noted; no rash etc...   Assessment & Plan:   DM>  She is improved w/ FBS in the 110-125 range now but hasn't lost any wt & this is crucial to controlling her DM;  We discussed diet + EXERCISE & the goal of losing 1 lb per week;  Labs today shows A1c down to 6.3 which is a great job & she is congratulated> same meds, must get wt down!!!  Ex-smoker>  She has remained quit & congratulated on this accomplishment as well...  CHOL>  Her FLP is abn & related to her diet, lack of exercise, etc;  We reviewed need for diet, wt reduction, or start meds soon...  OBESITY>  Everything hinges upon her losing weight...  HH>  She denies GERD, reflux, dysphagia, etc...  Crohn's>  Stable w/o abd pain, N V D or blood seen...  Other medical issues as noted.Marland KitchenMarland Kitchen

## 2010-10-12 NOTE — Telephone Encounter (Signed)
SN will call pt with her lab results.

## 2010-10-23 ENCOUNTER — Encounter: Payer: Self-pay | Admitting: Pulmonary Disease

## 2011-01-12 ENCOUNTER — Encounter: Payer: Self-pay | Admitting: Pulmonary Disease

## 2011-01-12 ENCOUNTER — Other Ambulatory Visit (INDEPENDENT_AMBULATORY_CARE_PROVIDER_SITE_OTHER): Payer: Self-pay

## 2011-01-12 ENCOUNTER — Ambulatory Visit (INDEPENDENT_AMBULATORY_CARE_PROVIDER_SITE_OTHER): Payer: Self-pay | Admitting: Pulmonary Disease

## 2011-01-12 DIAGNOSIS — E669 Obesity, unspecified: Secondary | ICD-10-CM

## 2011-01-12 DIAGNOSIS — K449 Diaphragmatic hernia without obstruction or gangrene: Secondary | ICD-10-CM

## 2011-01-12 DIAGNOSIS — E785 Hyperlipidemia, unspecified: Secondary | ICD-10-CM

## 2011-01-12 DIAGNOSIS — K5000 Crohn's disease of small intestine without complications: Secondary | ICD-10-CM

## 2011-01-12 DIAGNOSIS — E119 Type 2 diabetes mellitus without complications: Secondary | ICD-10-CM

## 2011-01-12 LAB — BASIC METABOLIC PANEL
CO2: 27 mEq/L (ref 19–32)
GFR: 84.32 mL/min (ref >60.00)
Glucose, Bld: 387 mg/dL — ABNORMAL HIGH (ref 70–99)
Potassium: 3.9 mEq/L (ref 3.5–5.1)
Sodium: 132 mEq/L — ABNORMAL LOW (ref 135–145)

## 2011-01-12 NOTE — Patient Instructions (Signed)
Today we updated your med list in epic...  Today we did your follow up fasting blood work...    Please call the PHONE TREE in a few days for your results...    Dial [id number] & when prompted enter your patient number followed by the # symbol...    Your patient number is:  [id number]#  Keep up the good work w/ diet 7 exercise!!!  Let's plan a follow up visit in about 4 month or so.Marland KitchenMarland Kitchen

## 2011-01-12 NOTE — Progress Notes (Signed)
Subjective:    Patient ID: Adrienne Goodwin, female    DOB: May 05, 1975, 35 y.o.   MRN: [id number]  HPI 38 y/o WF, daughter of Alma Mohiuddin, followed for general medical purposes>>  ~  June 14, 2010:  here to re-establish medical care after recent ER visit for blurry vision w/ dx of DM established w/ BS= 457... she was last seen in the [place] office in the 1990s by DrPatterson for Crohn's ileitis & by DrPlotnikov for general medical care... she stopped all medical follow up & gained weight up to 220# range until recently noted polyuria & polydipsia w/ blurred vision that lead her to her eye doctor & fingerstick BS> 400... she went to the [facility] ER 2/19 where BS=472, neg urine ketones, & she was started on GLIMEPIRIDE 2mg  Qam & told to f/u here... We discussed Diet, Exercise, & added Metformin 500mg  Bid.  ~  July 04, 2010:  3 week ROV recheck> tolerating Metformin two times a day & Glimep 2mg Qam> BS at home= much improved w/ FBS in the 130-140 range (down from 250 range); she's been to the [facility] w/ diet education but so far wt up 1-2#...    She is c/o some head & chest congestion w/ coupy cough, some CP from coughing but no F C S, no sputum, denies SOB etc> rec to take Mucinex 1-2 Bid w/ plenty of water, Rx Hydromet cough syrup, & ZPak for Prn use...  ~  August 16, 2010:  6wk ROV & doing much better> on diet but wt essent unchanged & not really exercising (states she's losing inches, not lbs);  We reviewed diet + exercise prescription & goal of 1 lb wt reduction per week;  Notes FBS in the 110-125 range now, had one hypoglycemic event at 4pm one day w/ BS 37 (we discussed staying away from sweets, use peanut butter or nabs);  We discussed wt watchers, going w/ a buddy, etc...  Plan ROV 6 wks w/ fasting blood work...  ~  October 12, 2010:  61mo ROV & she is feeling better but has gained 4# to 226# today ("I watch what I eat"); we reviewed diet & exercise program ("I visit Mom in the hosp") once  again; BS at home are good in the 120 range w/o hypoglycemia> NOTE: she decr the Gluimepiride herself to 1/2 of the 2mg  tab each AM; labs today showed BS=126, A1c=6.3 (OK to leave Glimep at 1mg /d)...  We checked CXR (clear, NAD), EKG (NSR, minNSSTTWA), Labs (see below)...  ~  January 12, 2011:  80mo ROV & she has finally lost some weight down 12# to 214# today on diet & exercise program; states accuchecks at home in the 100-120 range; LABS TODAY w/ BS 387 A1c 10.7 & I called RA on [address]- last filled Metformin #60 & Glimep #30 on 09/19/10!!!    Ex-smoker> she quit in 2011 & has remained quit; CXR 6/12 was clear, NAD...    Hyperlipid> on diet alone for now, prev FLP 6/12 wasn't good & she is committed to diet, exercise, etc...    DM> on Metform500Bid & Glimep2mg -1/2 inAM; labs today very disappointing w/ BS 387, A1c 10.7; Post OV- ?if taking meds regularly- we have a call into her to discuss options...    Obesity> wt down 12# to 214# today she says "just watching what I eat"- low carb & portion control she says...    GI> HH, Hx Crohn's> she is c/o some reflux symptoms, heartburn, etc and  treating self w/ Tums; we discussed further eval but she declines (no insurance) & decided to Rx w/ OMEPRAZOLE 40mg /d taken 30 min before the 1st meal of the day.          Problem List:    Hx of SINUSITIS (ICD-473.9) - s/p deviated septum surg 1991 by DrLawrence... she has prev allergy testing by DrESL in 1996 w/ +reactions to mold & dust...  CIGARETTE SMOKER (ICD-305.1) - she quit smoking in 2011 when her father had MI... ~  CXR 6/12 showed clear lungs, NAD... ~  EKG 6/12 showed NSR, minor NSSTTWA...  HYPERLIPIDEMIA>  On Diet (low chol, low fat) to start, if FLP not trending better then we will start meds... ~  FLP 6/12 on diet alone showed TChol 241, TG 289, HDL 37, LDL 166... We decided on diet & exercise to start...  DIABETES MELLITUS (ICD-250.00) - new DX 2/12> started on DIET/ EXERCISE, GLIMEPIRIDE  2mg  (now down to 1/2 Qam) & METFORMIN 500mg Bid... ~  NEW PT labs 2/12 showed BS= 472, A1c= 9.9.Marland KitchenMarland Kitchen started Metform500Bid & Glimep2mg /d. ~  4/12:  She notes FBS down to 110-125 range, but hasn't lost any wt & we discussed this... ~  Labs 6/12 showed BS= 126, A1c= 6.3, Umicroalb= neg... Great job, now get wt down; she decr the [employer]- 1/2 tab Qam on her own. ~  9/12: Wt down 12# to 214#; states on meds regularly (Metform500Bid & Glim2mg -1/2am), BS=387 & A1c=10.7, Pharm says last refilled 30d supplies 09/19/10!!!  OBESITY (ICD-278.00) - we disussed wt reduction & referral to [facility]... ~  review of paper chart shows weights 180-200 in late 90's  ~  weight 2/12 = 219# ==> 2 wk f/u recheck wt=221# & we reviewed the diet recommendations. ~  Weight 4/12 = 221# but she notes "I'm losing inches"... Discussed goal of losing 1 lb every week. ~  Weight 6/12 = 226# & we discussed need for a REAL diet & exercise program. ~  Weight 9/12 = 214#  HIATAL HERNIA (ICD-553.3) - she had EGD by DrPatterson 1999 w/ sm HH, otherw neg... ~  AbdSonar 1999 showed 2 sm GB polyps, no stones, no inflamm etc...  ?Hx of CROHN'S DISEASE, SMALL INTESTINE (ICD-555.0) - followed by DrPatterson & last colonoscopy was 1999 showing normal terminal ileum w/o signs of inflamm bowel dis... he felt that she prob had IBS and she was given trial Librax... ~  CT Abd/ Pelvis in 1995 was neg x for complex right ovarian cyst (referred to DrSSmith). ~  note: she had a neg colonoscopy 1995 by DrPerry...  BORDERLINE LFTs> likely FATTY LIVER DISEASE> we discussed this condition & the need to lose weight. ~  Labs 6/12 showed SGOT= 40, SGPT= 55  Hx of OVARIAN CYST (ICD-620.2) - prev GYN eval by DrSigSmith  Hx of DERMATITIS (ICD-692.9) PERSONAL HISTORY OF ALLERGY TO LATEX (ICD-V15.07) - sent to [facility] for eval in 1997.   Past Surgical History  Procedure Date  . Tonsillectomy and adenoidectomy   . Nasal septum surgery      Outpatient Encounter Prescriptions as of 01/12/2011  Medication Sig Dispense Refill  . glimepiride (AMARYL) 2 MG tablet Take 1/2 tablet by mouth every morning      . glucose blood test strip 1 each by Other route as needed. Use as instructed       . guaiFENesin (MUCINEX) 600 MG 12 hr tablet Take 1,200 mg by mouth 2 (two) times daily.        Marland Kitchen  metFORMIN (GLUCOPHAGE) 500 MG tablet Take 500 mg by mouth 2 (two) times daily with a meal.        . DISCONTD: HYDROcodone-homatropine (HYCODAN) 5-1.5 MG/5ML syrup Take 5 mLs by mouth every 6 (six) hours as needed.  120 mL  0    Allergies  Allergen Reactions  . Latex     REACTION: rash    Current Medications, Allergies, Past Medical History, Past Surgical History, Family History, and Social History were reviewed in [employer] record.     Review of Systems         See HPI - all other systems neg except as noted... The patient complains of hoarseness and prolonged cough.  The patient denies anorexia, fever, weight loss, weight gain, vision loss, decreased hearing, chest pain, syncope, dyspnea on exertion, peripheral edema, headaches, hemoptysis, abdominal pain, melena, hematochezia, severe indigestion/heartburn, hematuria, incontinence, muscle weakness, suspicious skin lesions, transient blindness, difficulty walking, depression, unusual weight change, abnormal bleeding, enlarged lymph nodes, and angioedema.     Objective:   Physical Exam     WD, Obese, 35 y/o WF in NAD... she is somewhat anxious... GENERAL:  Alert & oriented; pleasant & cooperative... HEENT:  [place]/AT, EOM-wnl, PERRLA, Fundi-benign, EACs-clear, TMs-wnl, NOSE-clear, THROAT-clear & wnl. NECK:  Supple w/ full ROM; no JVD; normal carotid impulses w/o bruits; no thyromegaly or nodules palpated; no lymphadenopathy. CHEST:  Croupy cough but chest clear to P & A; without wheezes/ rales/ or rhonchi heard... HEART:  Regular Rhythm; without murmurs/ rubs/ or  gallops. ABDOMEN:  Soft & nontender; normal bowel sounds; no organomegaly or masses detected. EXT: without deformities or arthritic changes; no varicose veins/ venous insuffic/ or edema. NEURO:  CN's intact; motor testing normal; sensory testing normal; gait normal & balance OK. DERM:  No lesions noted; no rash etc...   Assessment & Plan:   DM>  Finally got wt down some, but labs at odds w/ her report of accuchecks; states she's taking meds regularly but pharm says she last got 34mo supplies 09/19/10; PostOV we have placed a call to her to reconcile this & decide about Rx going forward> get on meds and take regularly vs referral to Endocrine/ DM specialist...   Ex-smoker>  She has remained quit & congratulated on this accomplishment as well...  CHOL>  Her FLP is abn & she will likely need meds for this as well; We reviewed need for diet, wt reduction, or start meds soon...  OBESITY>  Everything hinges upon her losing weight & she needs to be serious about diet & exercise...  HH>  She notes new onset reflux symptoms; she declines GI referral or further eval w/ EGD etc; we discussed Rx w/ OMEP40mg /d & careful f/u...  Crohn's>  Stable w/o abd pain, N V D or blood seen...  Other medical issues as noted.Marland KitchenMarland Kitchen

## 2011-01-13 ENCOUNTER — Encounter: Payer: Self-pay | Admitting: Pulmonary Disease

## 2011-01-16 ENCOUNTER — Other Ambulatory Visit: Payer: Self-pay | Admitting: *Deleted

## 2011-01-16 MED ORDER — METFORMIN HCL 500 MG PO TABS: 500.0000 mg | ORAL_TABLET | Freq: Two times a day (BID) | ORAL | Status: DC

## 2011-01-16 MED ORDER — GLIMEPIRIDE 2 MG PO TABS: 2.0000 mg | ORAL_TABLET | Freq: Every day | ORAL | Status: DC

## 2011-05-10 ENCOUNTER — Encounter: Payer: Self-pay | Admitting: Pulmonary Disease

## 2011-05-10 ENCOUNTER — Other Ambulatory Visit (INDEPENDENT_AMBULATORY_CARE_PROVIDER_SITE_OTHER): Payer: Self-pay

## 2011-05-10 ENCOUNTER — Ambulatory Visit (INDEPENDENT_AMBULATORY_CARE_PROVIDER_SITE_OTHER): Payer: Self-pay | Admitting: Pulmonary Disease

## 2011-05-10 VITALS — BP 120/84 | HR 107 | Temp 98.8°F | Ht 65.0 in | Wt 225.4 lb

## 2011-05-10 DIAGNOSIS — E669 Obesity, unspecified: Secondary | ICD-10-CM

## 2011-05-10 DIAGNOSIS — E119 Type 2 diabetes mellitus without complications: Secondary | ICD-10-CM

## 2011-05-10 DIAGNOSIS — E785 Hyperlipidemia, unspecified: Secondary | ICD-10-CM

## 2011-05-10 DIAGNOSIS — K449 Diaphragmatic hernia without obstruction or gangrene: Secondary | ICD-10-CM

## 2011-05-10 DIAGNOSIS — Z23 Encounter for immunization: Secondary | ICD-10-CM

## 2011-05-10 LAB — HEMOGLOBIN A1C: Hgb A1c MFr Bld: 8.8 % — ABNORMAL HIGH (ref 4.6–6.5)

## 2011-05-10 LAB — BASIC METABOLIC PANEL
BUN: 11 mg/dL (ref 6–23)
Chloride: 101 mEq/L (ref 96–112)
Glucose, Bld: 284 mg/dL — ABNORMAL HIGH (ref 70–99)
Potassium: 4 mEq/L (ref 3.5–5.1)

## 2011-05-10 NOTE — Progress Notes (Signed)
Subjective:    Patient ID: Adrienne Goodwin, female    DOB: 11-Nov-1975, 36 y.o.   MRN: [id number]  HPI 83 y/o WF, daughter of Adrienne Goodwin, followed for general medical purposes>>  ~  June 14, 2010:  here to re-establish medical care after recent ER visit for blurry vision w/ dx of DM established w/ BS= 457... she was last seen in the [place] office in the 1990s by DrPatterson for Crohn's ileitis & by DrPlotnikov for general medical care... she stopped all medical follow up & gained weight up to 220# range until recently noted polyuria & polydipsia w/ blurred vision that lead her to her eye doctor & fingerstick BS> 400... she went to the [facility] ER 2/19 where BS=472, neg urine ketones, & she was started on GLIMEPIRIDE 2mg  Qam & told to f/u here... We discussed Diet, Exercise, & added Metformin 500mg  Bid.  ~  July 04, 2010:  3 week ROV recheck> tolerating Metformin two times a day & Glimep 2mg Qam> BS at home= much improved w/ FBS in the 130-140 range (down from 250 range); she's been to the [facility] w/ diet education but so far wt up 1-2#...    She is c/o some head & chest congestion w/ coupy cough, some CP from coughing but no F C S, no sputum, denies SOB etc> rec to take Mucinex 1-2 Bid w/ plenty of water, Rx Hydromet cough syrup, & ZPak for Prn use...  ~  August 16, 2010:  6wk ROV & doing much better> on diet but wt essent unchanged & not really exercising (states she's losing inches, not lbs);  We reviewed diet + exercise prescription & goal of 1 lb wt reduction per week;  Notes FBS in the 110-125 range now, had one hypoglycemic event at 4pm one day w/ BS 37 (we discussed staying away from sweets, use peanut butter or nabs);  We discussed wt watchers, going w/ a buddy, etc...  Plan ROV 6 wks w/ fasting blood work...  ~  October 12, 2010:  35mo ROV & she is feeling better but has gained 4# to 226# today ("I watch what I eat"); we reviewed diet & exercise program ("I visit Mom in the hosp") once  again; BS at home are good in the 120 range w/o hypoglycemia> NOTE: she decr the Gluimepiride herself to 1/2 of the 2mg  tab each AM; labs today showed BS=126, A1c=6.3 (OK to leave Glimep at 1mg /d)...  We checked CXR (clear, NAD), EKG (NSR, minNSSTTWA), Labs (see below)...  ~  January 12, 2011:  11mo ROV & she has finally lost some weight down 12# to 214# today on diet & exercise program; states accuchecks at home in the 100-120 range; LABS TODAY w/ BS 387 A1c 10.7 & I called RA on [address]- last filled Metformin #60 & Glimep #30 on 09/19/10!!!    We read her the "riot act"> either get on meds regularly & take every day (+diet & exercise) so we can assess efficacy, or start insulin & refer to DM specialist...  ~  May 10, 2011:  79mo ROV & she has gained 11# up to 225#; states she's taking both meds regularly ([facility] refills) but not checking BS at home; requests flu shot today- ok...    Ex-smoker> she quit in 2011 & has remained quit; CXR 6/12 was clear, NAD...    Hyperlipid> on diet alone for now, prev FLP 6/12 wasn't good & she is committed to diet, exercise, etc..Marland Kitchen  DM> on Metform500Bid & Glimep2mg AM; labs today showed BS=284, A1c=8.8; rec increase both Metform to 2tabsBid & Glimep 4mg  Qam...    Obesity> wt back up 11# to 225# & we reviewed diet, exercise, & wt reduction strategies...    GI> HH, Hx Crohn's> she is c/o some reflux symptoms, heartburn, etc and treating self w/ Tums; we discussed further eval but she declines (no insurance) & decided to Rx w/ OMEPRAZOLE 40mg /d taken 30 min before the 1st meal of the day.          Problem List:    Hx of SINUSITIS (ICD-473.9) - s/p deviated septum surg 1991 by DrLawrence... she has prev allergy testing by DrESL in 1996 w/ +reactions to mold & dust...  CIGARETTE SMOKER (ICD-305.1) - she quit smoking in 2011 when her father had MI... ~  CXR 6/12 showed clear lungs, NAD... ~  EKG 6/12 showed NSR, minor NSSTTWA...  HYPERLIPIDEMIA>  On  Diet (low chol, low fat) to start, if FLP not trending better then we will start meds... ~  FLP 6/12 on diet alone showed TChol 241, TG 289, HDL 37, LDL 166... We decided on diet & exercise to start...  DIABETES MELLITUS (ICD-250.00) - new DX 2/12> started on DIET/ EXERCISE, GLIMEPIRIDE 2mg  & METFORMIN 500mg Bid... ~  NEW PT labs 2/12 showed BS= 472, A1c= 9.9.Marland KitchenMarland Kitchen started Metform500Bid & Glimep2mg /d. ~  4/12:  She notes FBS down to 110-125 range, but hasn't lost any wt & we discussed this... ~  Labs 6/12 showed BS= 126, A1c= 6.3, Umicroalb= neg... Great job, now get wt down; she decr the [employer]- 1/2 tab Qam on her own. ~  9/12: Wt down 12# to 214#; states on meds regularly (Metform500Bid & Glim2mg -1/2am), BS=387 & A1c=10.7, Pharm says last refilled 30d supplies 09/19/10!!! ~  1/13: Wt back up 11# to 225#; on Metform500Bid & Glimep2mg /d confirmed by Pharm> BS=284, A1c=8.8; REC> incr Metform1000Bid & Glimep4mg /d.  OBESITY (ICD-278.00) - we disussed wt reduction & referral to [facility]... ~  review of paper chart shows weights 180-200 in late 90's  ~  weight 2/12 = 219# ==> 2 wk f/u recheck wt=221# & we reviewed the diet recommendations. ~  Weight 4/12 = 221# but she notes "I'm losing inches"... Discussed goal of losing 1 lb every week. ~  Weight 6/12 = 226# & we discussed need for a REAL diet & exercise program. ~  Weight 9/12 = 214# ~  Weight 1/13 = 225#  HIATAL HERNIA (ICD-553.3) - she had EGD by DrPatterson 1999 w/ sm HH, otherw neg... ~  AbdSonar 1999 showed 2 sm GB polyps, no stones, no inflamm etc...  ?Hx of CROHN'S DISEASE, SMALL INTESTINE (ICD-555.0) - followed by DrPatterson & last colonoscopy was 1999 showing normal terminal ileum w/o signs of inflamm bowel dis... he felt that she prob had IBS and she was given trial Librax... ~  CT Abd/ Pelvis in 1995 was neg x for complex right ovarian cyst (referred to DrSSmith). ~  note: she had a neg colonoscopy 1995 by DrPerry...  BORDERLINE  LFTs> likely FATTY LIVER DISEASE> we discussed this condition & the need to lose weight. ~  Labs 6/12 showed SGOT= 40, SGPT= 55  Hx of OVARIAN CYST (ICD-620.2) - prev GYN eval by DrSigSmith  Hx of DERMATITIS (ICD-692.9) PERSONAL HISTORY OF ALLERGY TO LATEX (ICD-V15.07) - sent to [facility] for eval in 1997.   Past Surgical History  Procedure Date  . Tonsillectomy and adenoidectomy   . Nasal septum  surgery     Outpatient Encounter Prescriptions as of 05/10/2011  Medication Sig Dispense Refill  . glimepiride (AMARYL) 2 MG tablet Take 1 tablet (2 mg total) by mouth daily before breakfast.  90 tablet  3  . metFORMIN (GLUCOPHAGE) 500 MG tablet Take 1 tablet (500 mg total) by mouth 2 (two) times daily with a meal.  180 tablet  3  . glucose blood test strip 1 each by Other route as needed. Use as instructed       . guaiFENesin (MUCINEX) 600 MG 12 hr tablet Take 1,200 mg by mouth 2 (two) times daily.          Allergies  Allergen Reactions  . Latex     REACTION: rash    Current Medications, Allergies, Past Medical History, Past Surgical History, Family History, and Social History were reviewed in [employer] record.     Review of Systems         See HPI - all other systems neg except as noted... The patient complains of hoarseness and prolonged cough.  The patient denies anorexia, fever, weight loss, weight gain, vision loss, decreased hearing, chest pain, syncope, dyspnea on exertion, peripheral edema, headaches, hemoptysis, abdominal pain, melena, hematochezia, severe indigestion/heartburn, hematuria, incontinence, muscle weakness, suspicious skin lesions, transient blindness, difficulty walking, depression, unusual weight change, abnormal bleeding, enlarged lymph nodes, and angioedema.     Objective:   Physical Exam     WD, Obese, 36 y/o WF in NAD... she is somewhat anxious... GENERAL:  Alert & oriented; pleasant & cooperative... HEENT:  [place]/AT, EOM-wnl,  PERRLA, Fundi-benign, EACs-clear, TMs-wnl, NOSE-clear, THROAT-clear & wnl. NECK:  Supple w/ full ROM; no JVD; normal carotid impulses w/o bruits; no thyromegaly or nodules palpated; no lymphadenopathy. CHEST:  Croupy cough but chest clear to P & A; without wheezes/ rales/ or rhonchi heard... HEART:  Regular Rhythm; without murmurs/ rubs/ or gallops. ABDOMEN:  Soft & nontender; normal bowel sounds; no organomegaly or masses detected. EXT: without deformities or arthritic changes; no varicose veins/ venous insuffic/ or edema. NEURO:  CN's intact; motor testing normal; sensory testing normal; gait normal & balance OK. DERM:  No lesions noted; no rash etc...  RADIOLOGY DATA:  Reviewed in the EPIC EMR & discussed w/ the patient...    >>Last CXR 6/12 showed normal heart size, clear lungs, NAD...    >>EKG 6/12 showed NSR, rate78, minor NSSTTWA...  LABORATORY DATA:  Reviewed in the EPIC EMR & discussed w/ the patient...   Assessment & Plan:   DM>  Weight is back up & we reviewed diet, exercise & wt reduction strategies;  BS/ A1c are improved but not at goal & she is rec to incr METFORMIN to 1000Bid & increase Glimepiride 4mg Qam...  Ex-smoker>  She has remained quit & congratulated on this accomplishment as well...  CHOL>  Her FLP is abn & she will likely need meds for this as well; We reviewed need for diet, wt reduction, or start meds soon...  OBESITY>  Everything hinges upon her losing weight & she needs to be serious about diet & exercise...  HH>  She notes new onset reflux symptoms; she declines GI referral or further eval w/ EGD etc; we discussed Rx w/ OMEP40mg /d & careful f/u...  Crohn's>  Stable w/o abd pain, N V D or blood seen...  Other medical issues as noted.Marland KitchenMarland Kitchen

## 2011-05-10 NOTE — Patient Instructions (Signed)
Today we updated your med list in our EPIC system...    Continue your current medications the same...  Nice job of taking your meds religiously!!!  Today we did your follow up blood work...    Please call the [facility] in a few days for your results...    Dial [id number] & when prompted enter your patient number followed by the # symbol...    Your patient number is:  [id number]#  Call for any questions...  Now we MUST work on weight reduction!!!  Let's plan a similar follow up visit to check your progress in 3-4 months.Marland KitchenMarland Kitchen

## 2011-05-27 ENCOUNTER — Encounter: Payer: Self-pay | Admitting: Pulmonary Disease

## 2011-05-29 ENCOUNTER — Other Ambulatory Visit: Payer: Self-pay | Admitting: *Deleted

## 2011-05-29 MED ORDER — GLIMEPIRIDE 4 MG PO TABS: 4.0000 mg | ORAL_TABLET | Freq: Every day | ORAL | Status: DC

## 2011-05-29 MED ORDER — METFORMIN HCL 500 MG PO TABS: 1000.0000 mg | ORAL_TABLET | Freq: Two times a day (BID) | ORAL | Status: DC

## 2011-08-08 ENCOUNTER — Encounter: Payer: Self-pay | Admitting: Pulmonary Disease

## 2011-08-08 ENCOUNTER — Ambulatory Visit (INDEPENDENT_AMBULATORY_CARE_PROVIDER_SITE_OTHER): Payer: Self-pay | Admitting: Pulmonary Disease

## 2011-08-08 ENCOUNTER — Other Ambulatory Visit (INDEPENDENT_AMBULATORY_CARE_PROVIDER_SITE_OTHER): Payer: Self-pay

## 2011-08-08 VITALS — BP 120/86 | HR 105 | Temp 97.4°F | Ht 65.0 in | Wt 229.2 lb

## 2011-08-08 DIAGNOSIS — E119 Type 2 diabetes mellitus without complications: Secondary | ICD-10-CM

## 2011-08-08 DIAGNOSIS — K5000 Crohn's disease of small intestine without complications: Secondary | ICD-10-CM

## 2011-08-08 DIAGNOSIS — E785 Hyperlipidemia, unspecified: Secondary | ICD-10-CM

## 2011-08-08 DIAGNOSIS — E669 Obesity, unspecified: Secondary | ICD-10-CM

## 2011-08-08 NOTE — Progress Notes (Signed)
Subjective:    Patient ID: Adrienne Goodwin, female    DOB: 1975/06/24, 36 y.o.   MRN: [id number]  HPI 25 y/o WF, daughter of [name], followed for general medical purposes>>  ~  June 14, 2010:  here to re-establish medical care after recent ER visit for blurry vision w/ dx of DM established w/ BS= 457... she was last seen in the [place] office in the 1990s by DrPatterson for Crohn's ileitis & by DrPlotnikov for general medical care... she stopped all medical follow up & gained weight up to 220# range until recently noted polyuria & polydipsia w/ blurred vision that lead her to her eye doctor & fingerstick BS> 400... she went to the [facility] ER 2/19 where BS=472, neg urine ketones, & she was started on GLIMEPIRIDE 2mg  Qam & told to f/u here... We discussed Diet, Exercise, & added Metformin 500mg  Bid.  ~  July 04, 2010:  3 week ROV recheck> tolerating Metformin two times a day & Glimep 2mg Qam> BS at home= much improved w/ FBS in the 130-140 range (down from 250 range); she's been to the [facility] w/ diet education but so far wt up 1-2#...    She is c/o some head & chest congestion w/ coupy cough, some CP from coughing but no F C S, no sputum, denies SOB etc> rec to take Mucinex 1-2 Bid w/ plenty of water, Rx Hydromet cough syrup, & ZPak for Prn use...  ~  August 16, 2010:  6wk ROV & doing much better> on diet but wt essent unchanged & not really exercising (states she's losing inches, not lbs);  We reviewed diet + exercise prescription & goal of 1 lb wt reduction per week;  Notes FBS in the 110-125 range now, had one hypoglycemic event at 4pm one day w/ BS 37 (we discussed staying away from sweets, use peanut butter or nabs);  We discussed wt watchers, going w/ a buddy, etc...  Plan ROV 6 wks w/ fasting blood work...  ~  October 12, 2010:  761mo ROV & she is feeling better but has gained 4# to 226# today ("I watch what I eat"); we reviewed diet & exercise program ("I visit Mom in the hosp") once  again; BS at home are good in the 120 range w/o hypoglycemia> NOTE: she decr the Gluimepiride herself to 1/2 of the 2mg  tab each AM; labs today showed BS=126, A1c=6.3 (OK to leave Glimep at 1mg /d)...  We checked CXR (clear, NAD), EKG (NSR, minNSSTTWA), Labs (see below)...  ~  January 12, 2011:  61mo ROV & she has finally lost some weight down 12# to 214# today on diet & exercise program; states accuchecks at home in the 100-120 range; LABS TODAY w/ BS 387 A1c 10.7 & I called RA on [place]- last filled Metformin #60 & Glimep #30 on 09/19/10!!!    We read her the "riot act"> either get on meds regularly & take every day (+diet & exercise) so we can assess efficacy, or start insulin & refer to DM specialist...  ~  May 10, 2011:  25mo ROV & she has gained 11# up to 225#; states she's taking both meds regularly ([facility] refills) but not checking BS at home; requests flu shot today- ok...    Ex-smoker> she quit in 2011 & has remained quit; CXR 6/12 was clear, NAD...    Hyperlipid> on diet alone for now, prev FLP 6/12 wasn't good & she is committed to diet, exercise, etc..Marland Kitchen  DM> on Metform500Bid & Glimep2mg AM; labs today showed BS=284, A1c=8.8; rec increase both Metform to 2tabsBid & Glimep 4mg  Qam...    Obesity> wt back up 11# to 225# & we reviewed diet, exercise, & wt reduction strategies...    GI> HH, Hx Crohn's> she is c/o some reflux symptoms, heartburn, etc and treating self w/ Tums; we discussed further eval but she declines (no insurance) & decided to Rx w/ OMEPRAZOLE 40mg /d taken 30 min before the 1st meal of the day.  ~  August 08, 2011:  39mo ROV & Ambry has gained 3# to 229# today & we reviewed diet, exercise & wt reduction program; she cannot afford dietary consult (she has no insurance) & bariatric surg is out of the question; I suggested that she call & look into medicaid...  BS at home when checked 2Hpc is around 160 she says> fingerstick BS= 215, and A1c=7.9.Marland KitchenMarland Kitchen  See prob list  below>>          Problem List:    Hx of SINUSITIS (ICD-473.9) - s/p deviated septum surg 1991 by DrLawrence... she has prev allergy testing by DrESL in 1996 w/ +reactions to mold & dust... ~  4/13:  Springtime allergies acting up, using OTC antihist & given samples of Nasonex for prn use...  CIGARETTE SMOKER (ICD-305.1) - she quit smoking in 2011 when her father had MI... ~  CXR 6/12 showed clear lungs, NAD... ~  EKG 6/12 showed NSR, minor NSSTTWA... ~  She denies cough, sput, hemoptysis, CP, palpit, SOB, edema; advised exercise program...  HYPERLIPIDEMIA>  On Diet alone (low chol, low fat), if FLP not trending better then we will start meds... ~  FLP 6/12 on diet alone showed TChol 241, TG 289, HDL 37, LDL 166... We decided on diet & exercise to start... ~  4/13: here for afternoon appt & we discussed f/u FLP on ret fasting in AM...  DIABETES MELLITUS (ICD-250.00) - on GLIMEPIRIDE 4mg Qam & METFORMIN 500mg - 2Bid... ~  NEW PT labs 2/12 showed BS= 472, A1c= 9.9.Marland KitchenMarland Kitchen started Metform500Bid & Glimep2mg /d. ~  4/12:  She notes FBS down to 110-125 range, but hasn't lost any wt & we discussed this... ~  Labs 6/12 showed BS= 126, A1c= 6.3, Umicroalb= neg... Great job, now get wt down; she decr the [employer]- 1/2 tab Qam on her own. ~  9/12: Wt down 12# to 214#; states on meds regularly (Metform500Bid & Glim2mg -1/2am), BS=387 & A1c=10.7, Pharm says last refilled 30d supplies 09/19/10!!! ~  1/13: Wt back up 11# to 225#; on Metform500Bid & Glimep2mg /d confirmed by Pharm> BS=284, A1c=8.8; REC> incr Metform1000Bid & Glimep4mg /d. ~  Labs 4/13 on Metform2Bid+Glimep4 showed FSBS= 215, A1c= 7.9; we reviewed options & cost of additional meds; SHE MUST GET WT DOWN!!!  OBESITY (ICD-278.00) - we disussed wt reduction & referral to [facility]... ~  review of paper chart shows weights 180-200 in late 90's  ~  weight 2/12 = 219# ==> 2 wk f/u recheck wt=221# & we reviewed the diet recommendations. ~  Weight 4/12 =  221# but she notes "I'm losing inches"... Discussed goal of losing 1 lb every week. ~  Weight 6/12 = 226# & we discussed need for a REAL diet & exercise program. ~  Weight 9/12 = 214# ~  Weight 1/13 = 225#... We reviewed diet, exercise, wt reduction program. ~  Weight 4/13 = 229#  HIATAL HERNIA (ICD-553.3) - she had EGD by DrPatterson 1999 w/ sm HH, otherw neg... ~  AbdSonar 1999 showed  2 sm GB polyps, no stones, no inflamm etc... ~  1/13:  She is c/o reflux symptoms but declines GI referral; we discussed trial OMEPRAZOLE 40mg /d, antireflux regimen etc...  ?Hx of CROHN'S DISEASE, SMALL INTESTINE (ICD-555.0) - followed by DrPatterson & last colonoscopy was 1999 showing normal terminal ileum w/o signs of inflamm bowel dis... he felt that she prob had IBS and she was given trial Librax... ~  CT Abd/ Pelvis in 1995 was neg x for complex right ovarian cyst (referred to DrSSmith). ~  note: she had a neg colonoscopy 1995 by DrPerry...  BORDERLINE LFTs> likely FATTY LIVER DISEASE> we discussed this condition & the need to lose weight. ~  Labs 6/12 showed SGOT= 40, SGPT= 55  Hx of OVARIAN CYST (ICD-620.2) - prev GYN eval by DrSigSmith  Hx of DERMATITIS (ICD-692.9) PERSONAL HISTORY OF ALLERGY TO LATEX (ICD-V15.07) - sent to [facility] for eval in 1997.  HEALTH MAINTENANCE >> ~  DM:  Needs DM eye exam & advised to sched at her convenience;  Monofilament test is normal... ~  GI:  Followed by DrPatterson w/ prob IBS, neg colonoscopy 1999, now 36y/o & no GI symptoms... ~  GYN:  It's been >78yrs since her last GYN check & PAP, advised to sched at her convenience... ~  Immuniz:  Advised to get yearly seasonal flu vaccine each fall;  Needs baseline TDAP (wants to wait);  Needs baseline Pneumovax as well (Diabetic).   Past Surgical History  Procedure Date  . Tonsillectomy and adenoidectomy   . Nasal septum surgery     Outpatient Encounter Prescriptions as of 08/08/2011  Medication Sig Dispense Refill    . glimepiride (AMARYL) 4 MG tablet Take 1 tablet (4 mg total) by mouth daily before breakfast.  90 tablet  3  . glucose blood test strip 1 each by Other route as needed. Use as instructed       . guaiFENesin (MUCINEX) 600 MG 12 hr tablet Take 1,200 mg by mouth 2 (two) times daily.        . metFORMIN (GLUCOPHAGE) 500 MG tablet Take 2 tablets (1,000 mg total) by mouth 2 (two) times daily with a meal.  360 tablet  3    Allergies  Allergen Reactions  . Latex     REACTION: rash    Current Medications, Allergies, Past Medical History, Past Surgical History, Family History, and Social History were reviewed in [employer] record.     Review of Systems         See HPI - all other systems neg except as noted... The patient complains of hoarseness and prolonged cough.  The patient denies anorexia, fever, weight loss, weight gain, vision loss, decreased hearing, chest pain, syncope, dyspnea on exertion, peripheral edema, headaches, hemoptysis, abdominal pain, melena, hematochezia, severe indigestion/heartburn, hematuria, incontinence, muscle weakness, suspicious skin lesions, transient blindness, difficulty walking, depression, unusual weight change, abnormal bleeding, enlarged lymph nodes, and angioedema.     Objective:   Physical Exam     WD, Obese, 36 y/o WF in NAD... she is somewhat anxious... GENERAL:  Alert & oriented; pleasant & cooperative... HEENT:  [place]/AT, EOM-wnl, PERRLA, Fundi-benign, EACs-clear, TMs-wnl, NOSE-clear, THROAT-clear & wnl. NECK:  Supple w/ full ROM; no JVD; normal carotid impulses w/o bruits; no thyromegaly or nodules palpated; no lymphadenopathy. CHEST:  Croupy cough but chest clear to P & A; without wheezes/ rales/ or rhonchi heard... HEART:  Regular Rhythm; without murmurs/ rubs/ or gallops. ABDOMEN:  Soft & nontender;  normal bowel sounds; no organomegaly or masses detected. EXT: without deformities or arthritic changes; no varicose veins/  venous insuffic/ or edema. NEURO:  CN's intact; motor testing normal; sensory testing normal; gait normal & balance OK. DERM:  No lesions noted; no rash etc...  RADIOLOGY DATA:  Reviewed in the EPIC EMR & discussed w/ the patient...    >>Last CXR 6/12 showed normal heart size, clear lungs, NAD...    >>EKG 6/12 showed NSR, rate78, minor NSSTTWA...  LABORATORY DATA:  Reviewed in the EPIC EMR & discussed w/ the patient... LABS 4/13:  Fingerstick BS= 215, A1c= 7.9   Assessment & Plan:   DM>  Weight is up & we reviewed diet, exercise & wt reduction strategies;  On Glimep4 & Meform1000Bid> A1c= 7.9 which is sl improved; advised wt reduction or next step is 3rd DM med...  Ex-smoker>  She has remained quit & congratulated on this accomplishment!...  CHOL>  Her FLP is abn & she will likely need meds for this as well; We reviewed need for diet, wt reduction, or start meds soon...  OBESITY>  Everything hinges upon her losing weight & she needs to be serious about diet & exercise; she has no insurance & can't afford dietary counseling or Bariatric approach...  HH>  She notes new onset reflux symptoms; she declines GI referral or further eval w/ EGD etc; we discussed Rx w/ OMEP40mg /d & careful f/u...  Crohn's>  Stable w/o abd pain, N V D or blood seen...  Other medical issues as noted... Needs Pneumovax, TDAP, GYN check, etc but she wants to wait- no insurance & asked to look into medicaid...  Patient's Medications  New Prescriptions   No medications on file  Previous Medications   GLIMEPIRIDE (AMARYL) 4 MG TABLET    Take 1 tablet (4 mg total) by mouth daily before breakfast.   GLUCOSE BLOOD TEST STRIP    1 each by Other route as needed. Use as instructed    GUAIFENESIN (MUCINEX) 600 MG 12 HR TABLET    Take 1,200 mg by mouth 2 (two) times daily.     METFORMIN (GLUCOPHAGE) 500 MG TABLET    Take 2 tablets (1,000 mg total) by mouth 2 (two) times daily with a meal.  Modified Medications   No  medications on file  Discontinued Medications   No medications on file

## 2011-08-08 NOTE — Patient Instructions (Signed)
Today we updated your med list in our EPIC system...    Continue your current medications the same...  Today we checked your blood sugar & A1c...    We will call you w/ the result...  You know the importance of diet. Exercise, weight reduction, etc...    Call if we can be of assist in any way...  Let's plan another brief check up in 3 months.Marland KitchenMarland Kitchen

## 2011-08-20 ENCOUNTER — Encounter: Payer: Self-pay | Admitting: Pulmonary Disease

## 2011-11-07 ENCOUNTER — Ambulatory Visit (INDEPENDENT_AMBULATORY_CARE_PROVIDER_SITE_OTHER): Payer: Self-pay | Admitting: Pulmonary Disease

## 2011-11-07 ENCOUNTER — Encounter: Payer: Self-pay | Admitting: Pulmonary Disease

## 2011-11-07 ENCOUNTER — Other Ambulatory Visit (INDEPENDENT_AMBULATORY_CARE_PROVIDER_SITE_OTHER): Payer: Self-pay

## 2011-11-07 VITALS — BP 126/80 | HR 102 | Temp 97.2°F | Ht 65.0 in | Wt 221.4 lb

## 2011-11-07 DIAGNOSIS — E785 Hyperlipidemia, unspecified: Secondary | ICD-10-CM

## 2011-11-07 DIAGNOSIS — L259 Unspecified contact dermatitis, unspecified cause: Secondary | ICD-10-CM

## 2011-11-07 DIAGNOSIS — K5000 Crohn's disease of small intestine without complications: Secondary | ICD-10-CM

## 2011-11-07 DIAGNOSIS — E119 Type 2 diabetes mellitus without complications: Secondary | ICD-10-CM

## 2011-11-07 DIAGNOSIS — K449 Diaphragmatic hernia without obstruction or gangrene: Secondary | ICD-10-CM

## 2011-11-07 DIAGNOSIS — E669 Obesity, unspecified: Secondary | ICD-10-CM

## 2011-11-07 LAB — HEMOGLOBIN A1C: Hgb A1c MFr Bld: 9.2 % — ABNORMAL HIGH (ref 4.6–6.5)

## 2011-11-07 LAB — BASIC METABOLIC PANEL
BUN: 12 mg/dL (ref 6–23)
Chloride: 97 mEq/L (ref 96–112)
GFR: 74.34 mL/min (ref >60.00)
Glucose, Bld: 213 mg/dL — ABNORMAL HIGH (ref 70–99)
Potassium: 4.2 mEq/L (ref 3.5–5.1)
Sodium: 137 mEq/L (ref 135–145)

## 2011-11-07 NOTE — Progress Notes (Signed)
Subjective:    Patient ID: Adrienne Goodwin, female    DOB: 1975/12/30, 36 y.o.   MRN: [id number]  HPI 30 y/o WF, daughter of Ariely Riddell, followed for general medical purposes>>  ~  June 14, 2010:  here to re-establish medical care after recent ER visit for blurry vision w/ dx of DM established w/ BS= 457... she was last seen in the [place] office in the 1990s by DrPatterson for Crohn's ileitis & by DrPlotnikov for general medical care... she stopped all medical follow up & gained weight up to 220# range until recently noted polyuria & polydipsia w/ blurred vision that lead her to her eye doctor & fingerstick BS> 400... she went to the [facility] ER 2/19 where BS=472, neg urine ketones, & she was started on GLIMEPIRIDE 2mg  Qam & told to f/u here... We discussed Diet, Exercise, & added Metformin 500mg  Bid.  ~  July 04, 2010:  3 week ROV recheck> tolerating Metformin two times a day & Glimep 2mg Qam> BS at home= much improved w/ FBS in the 130-140 range (down from 250 range); she's been to the [facility] w/ diet education but so far wt up 1-2#...    She is c/o some head & chest congestion w/ coupy cough, some CP from coughing but no F C S, no sputum, denies SOB etc> rec to take Mucinex 1-2 Bid w/ plenty of water, Rx Hydromet cough syrup, & ZPak for Prn use...  ~  August 16, 2010:  6wk ROV & doing much better> on diet but wt essent unchanged & not really exercising (states she's losing inches, not lbs);  We reviewed diet + exercise prescription & goal of 1 lb wt reduction per week;  Notes FBS in the 110-125 range now, had one hypoglycemic event at 4pm one day w/ BS 37 (we discussed staying away from sweets, use peanut butter or nabs);  We discussed wt watchers, going w/ a buddy, etc...  Plan ROV 6 wks w/ fasting blood work...  ~  October 12, 2010:  61mo ROV & she is feeling better but has gained 4# to 226# today ("I watch what I eat"); we reviewed diet & exercise program ("I visit Mom in the hosp") once  again; BS at home are good in the 120 range w/o hypoglycemia> NOTE: she decr the Gluimepiride herself to 1/2 of the 2mg  tab each AM; labs today showed BS=126, A1c=6.3 (OK to leave Glimep at 1mg /d)...  We checked CXR (clear, NAD), EKG (NSR, minNSSTTWA), Labs (see below)...  ~  January 12, 2011:  635mo ROV & she has finally lost some weight down 12# to 214# today on diet & exercise program; states accuchecks at home in the 100-120 range; LABS TODAY w/ BS 387 A1c 10.7 & I called [facility] on [facility]- last filled Metformin #60 & Glimep #30 on 09/19/10!!!    We read her the "riot act"> either get on meds regularly & take every day (+diet & exercise) so we can assess efficacy, or start insulin & refer to DM specialist...  ~  May 10, 2011:  35mo ROV & she has gained 11# up to 225#; states she's taking both meds [facility]) but not checking BS at home; requests flu shot today- ok...    Ex-smoker> she quit in 2011 & has remained quit; CXR 6/12 was clear, NAD...    Hyperlipid> on diet alone for now, prev FLP 6/12 wasn't good & she is committed to diet, exercise, etc..Marland Kitchen  DM> on Metform500Bid & Glimep2mg AM; labs today showed BS=284, A1c=8.8; rec increase both Metform to 2tabsBid & Glimep 4mg  Qam...    Obesity> wt back up 11# to 225# & we reviewed diet, exercise, & wt reduction strategies...    GI> HH, Hx Crohn's> she is c/o some reflux symptoms, heartburn, etc and treating self w/ Tums; we discussed further eval but she declines (no insurance) & decided to Rx w/ OMEPRAZOLE 40mg /d taken 30 min before the 1st meal of the day.  ~  August 08, 2011:  473mo ROV & Adrienne Goodwin has gained 3# to 229# today & we reviewed diet, exercise & wt reduction program; she cannot afford dietary consult (she has no insurance) & bariatric surg is out of the question; I suggested that she call & look into medicaid...  BS at home when checked 2Hpc is around 160 she says> fingerstick BS= 215, and A1c=7.9.Marland KitchenMarland Kitchen  See prob list  below>>  ~  November 07, 2011:  473mo ROV & Adrienne Goodwin has lost 8# in the interim, taking her Metform&Glimep regularly, and she notes that recent post prandial BS have been 130-160 range... She has been walking some, but there id room for improvement she admits; no new complaints or concerns;  Unfortunately her f/u labs today showed BS= 213 and A1c= 9.2;  She cannot afford, and will not purchase Gliptins or Tzd meds;  Neither can she afford long acting insulin Rx like Lantus or Levemir;  Next step will be either NPH or 70/30 mixed insulin, but for now I'm asking her to improve diet, exercise, & double the glimepiride to 4mg Bid...    We reviewed prob list, meds, xrays and labs> see below>>          Problem List:    Hx of SINUSITIS (ICD-473.9) - s/p deviated septum surg 1991 by DrLawrence... she has prev allergy testing by DrESL in 1996 w/ +reactions to mold & dust... ~  4/13:  Springtime allergies acting up, using OTC antihist & given samples of Nasonex for prn use...  CIGARETTE SMOKER (ICD-305.1) - she quit smoking in 2011 when her father had MI... ~  CXR 6/12 showed clear lungs, NAD... ~  EKG 6/12 showed NSR, minor NSSTTWA... ~  She denies cough, sput, hemoptysis, CP, palpit, SOB, edema; advised exercise program...  HYPERLIPIDEMIA>  On Diet alone (low chol, low fat), if FLP not trending better then we will start meds... ~  FLP 6/12 on diet alone showed TChol 241, TG 289, HDL 37, LDL 166... We decided on diet & exercise to start... ~  4/13 & 7/13: here for afternoon appt & we discussed f/u FLP on ret fasting in AM...  DIABETES MELLITUS (ICD-250.00) - on GLIMEPIRIDE 4mg Qam & METFORMIN 500mg - 2Bid... ~  NEW PT labs 2/12 showed BS= 472, A1c= 9.9.Marland KitchenMarland Kitchen started Metform500Bid & Glimep2mg /d. ~  4/12:  She notes FBS down to 110-125 range, but hasn't lost any wt & we discussed this... ~  Labs 6/12 showed BS= 126, A1c= 6.3, Umicroalb= neg... Great job, now get wt down; she decr the [employer]- 1/2 tab Qam on her  own. ~  9/12: Wt down 12# to 214#; states on meds regularly (Metform500Bid & Glim2mg -1/2am), BS=387 & A1c=10.7, Pharm says last refilled 30d supplies 09/19/10!!! ~  1/13: Wt back up 11# to 225#; on Metform500Bid & Glimep2mg /d confirmed by Pharm> BS=284, A1c=8.8; REC> incr Metform1000Bid & Glimep4mg /d. ~  Labs 4/13 on Metform2Bid+Glimep4 showed FSBS= 215, A1c= 7.9; we reviewed options & cost of additional meds; SHE MUST  GET WT DOWN!!! ~  Labs 7/13 on Metform500-2Bid+Glim4 showed BS= 213, A1c= 9.2; Rec to incr Glim4mg Bid, get on diet, get wt down further...  OBESITY (ICD-278.00) - we disussed wt reduction & referral to [facility]... ~  review of paper chart shows weights 180-200 in late 90's  ~  weight 2/12 = 219# ==> 2 wk f/u recheck wt=221# & we reviewed the diet recommendations. ~  Weight 4/12 = 221# but she notes "I'm losing inches"... Discussed goal of losing 1 lb every week. ~  Weight 6/12 = 226# & we discussed need for a REAL diet & exercise program. ~  Weight 9/12 = 214# ~  Weight 1/13 = 225#... We reviewed diet, exercise, wt reduction program. ~  Weight 4/13 = 229# ~  Weight 7/13 = 221#  HIATAL HERNIA (ICD-553.3) - she had EGD by DrPatterson 1999 w/ sm HH, otherw neg... ~  AbdSonar 1999 showed 2 sm GB polyps, no stones, no inflamm etc... ~  1/13:  She is c/o reflux symptoms but declines GI referral; we discussed trial OMEPRAZOLE 40mg /d, antireflux regimen etc...  ?Hx of CROHN'S DISEASE, SMALL INTESTINE (ICD-555.0) - followed by DrPatterson & last colonoscopy was 1999 showing normal terminal ileum w/o signs of inflamm bowel dis... he felt that she prob had IBS and she was given trial Librax... ~  CT Abd/ Pelvis in 1995 was neg x for complex right ovarian cyst (referred to DrSSmith). ~  note: she had a neg colonoscopy 1995 by DrPerry...  BORDERLINE LFTs> likely FATTY LIVER DISEASE> we discussed this condition & the need to lose weight. ~  Labs 6/12 showed SGOT= 40, SGPT= 55  Hx of  OVARIAN CYST (ICD-620.2) - prev GYN eval by DrSigSmith  Hx of DERMATITIS (ICD-692.9) PERSONAL HISTORY OF ALLERGY TO LATEX (ICD-V15.07) - sent to [facility] for eval in 1997.  HEALTH MAINTENANCE >> ~  DM:  Needs DM eye exam & advised to sched at her convenience;  Monofilament test is normal... ~  GI:  Followed by DrPatterson w/ prob IBS, neg colonoscopy 1999, now 36y/o & no GI symptoms... ~  GYN:  It's been >30yrs since her last GYN check & PAP, advised to sched at her convenience... ~  Immuniz:  Advised to get yearly seasonal flu vaccine each fall;  Needs baseline TDAP (wants to wait);  Needs baseline Pneumovax as well (Diabetic).   Past Surgical History  Procedure Date  . Tonsillectomy and adenoidectomy   . Nasal septum surgery     Outpatient Encounter Prescriptions as of 11/07/2011  Medication Sig Dispense Refill  . glimepiride (AMARYL) 4 MG tablet Take 1 tablet (4 mg total) by mouth daily before breakfast.  90 tablet  3  . glucose blood test strip 1 each by Other route as needed. Use as instructed       . guaiFENesin (MUCINEX) 600 MG 12 hr tablet Take 1,200 mg by mouth 2 (two) times daily.        . metFORMIN (GLUCOPHAGE) 500 MG tablet Take 2 tablets (1,000 mg total) by mouth 2 (two) times daily with a meal.  360 tablet  3    Allergies  Allergen Reactions  . Latex     REACTION: rash    Current Medications, Allergies, Past Medical History, Past Surgical History, Family History, and Social History were reviewed in [employer] record.     Review of Systems         See HPI - all other systems neg except  as noted... The patient complains of hoarseness and prolonged cough.  The patient denies anorexia, fever, weight loss, weight gain, vision loss, decreased hearing, chest pain, syncope, dyspnea on exertion, peripheral edema, headaches, hemoptysis, abdominal pain, melena, hematochezia, severe indigestion/heartburn, hematuria, incontinence, muscle weakness,  suspicious skin lesions, transient blindness, difficulty walking, depression, unusual weight change, abnormal bleeding, enlarged lymph nodes, and angioedema.     Objective:   Physical Exam     WD, Obese, 36 y/o WF in NAD... she is somewhat anxious... GENERAL:  Alert & oriented; pleasant & cooperative... HEENT:  [place]/AT, EOM-wnl, PERRLA, Fundi-benign, EACs-clear, TMs-wnl, NOSE-clear, THROAT-clear & wnl. NECK:  Supple w/ full ROM; no JVD; normal carotid impulses w/o bruits; no thyromegaly or nodules palpated; no lymphadenopathy. CHEST:  Croupy cough but chest clear to P & A; without wheezes/ rales/ or rhonchi heard... HEART:  Regular Rhythm; without murmurs/ rubs/ or gallops. ABDOMEN:  Soft & nontender; normal bowel sounds; no organomegaly or masses detected. EXT: without deformities or arthritic changes; no varicose veins/ venous insuffic/ or edema. NEURO:  CN's intact; motor testing normal; sensory testing normal; gait normal & balance OK. DERM:  No lesions noted; no rash etc...  RADIOLOGY DATA:  Reviewed in the EPIC EMR & discussed w/ the patient...    >>Last CXR 6/12 showed normal heart size, clear lungs, NAD...    >>EKG 6/12 showed NSR, rate78, minor NSSTTWA...  LABORATORY DATA:  Reviewed in the EPIC EMR & discussed w/ the patient...   Assessment & Plan:   DM>  Weight is down 8#  & we reviewed diet, exercise & wt reduction strategies;  On Glimep4 & Meform1000Bid> A1c up to 9.2 which doesn't make sense but we reviewed diet 7 decided to incr Glimep4mg  Bid before next step of adding insulin...  Ex-smoker>  She has remained quit & congratulated on this accomplishment!...  CHOL>  Her FLP is abn & she will likely need meds for this as well; We reviewed need for diet, wt reduction, or start meds soon...  OBESITY>  Everything hinges upon her losing weight & she needs to be serious about diet & exercise; she has no insurance & can't afford dietary counseling or Bariatric approach...  HH>   She notes new onset reflux symptoms; she declines GI referral or further eval w/ EGD etc; we discussed Rx w/ OMEP40mg /d & careful f/u...  Crohn's>  Stable w/o abd pain, N V D or blood seen...  Other medical issues as noted... Needs Pneumovax, TDAP, GYN check, etc but she wants to wait- no insurance & asked to look into medicaid...   Patient's Medications  New Prescriptions   No medications on file  Previous Medications   DIPHENHYDRAMINE (SOMINEX) 25 MG TABLET    Take 25 mg by mouth at bedtime as needed.   GUAIFENESIN (MUCINEX) 600 MG 12 HR TABLET    Take 1,200 mg by mouth 2 (two) times daily as needed.    METFORMIN (GLUCOPHAGE) 500 MG TABLET    Take 2 tablets (1,000 mg total) by mouth 2 (two) times daily with a meal.  Modified Medications   Modified Medication Previous Medication   GLIMEPIRIDE (AMARYL) 4 MG TABLET glimepiride (AMARYL) 4 MG tablet      Take 1 tablet (4 mg total) by mouth 2 (two) times daily.    Take 1 tablet (4 mg total) by mouth daily before breakfast.   GLUCOSE BLOOD TEST STRIP glucose blood test strip      Test daily as needed  1 each by Other route as needed. Use as instructed   Discontinued Medications   No medications on file

## 2011-11-07 NOTE — Patient Instructions (Addendum)
Today we updated your med list in our EPIC system...    Continue your current medications the same...  Keep up the good work w/ diet AND exercise...    Continue the weight loss program...  Today we did your follow up Diabetic labs...    We will call you w/ these results...  Let's plan a follow up visit in 4 months w/ FASTING blood work at that time.Marland KitchenMarland Kitchen

## 2011-11-08 ENCOUNTER — Other Ambulatory Visit: Payer: Self-pay | Admitting: Pulmonary Disease

## 2011-11-08 MED ORDER — GLIMEPIRIDE 4 MG PO TABS: 4.0000 mg | ORAL_TABLET | Freq: Two times a day (BID) | ORAL | Status: DC

## 2011-11-10 ENCOUNTER — Other Ambulatory Visit: Payer: Self-pay | Admitting: Pulmonary Disease

## 2011-11-10 MED ORDER — GLUCOSE BLOOD VI STRP: ORAL_STRIP | Status: DC

## 2011-11-10 NOTE — Telephone Encounter (Signed)
Faxed [facility] Accu-chek Aviva test strips. Patient last seen 11/07/2011. Refill sent in

## 2012-03-12 ENCOUNTER — Encounter: Payer: Self-pay | Admitting: *Deleted

## 2012-03-13 ENCOUNTER — Ambulatory Visit (INDEPENDENT_AMBULATORY_CARE_PROVIDER_SITE_OTHER): Payer: Self-pay | Admitting: Pulmonary Disease

## 2012-03-13 ENCOUNTER — Encounter: Payer: Self-pay | Admitting: Pulmonary Disease

## 2012-03-13 ENCOUNTER — Other Ambulatory Visit (INDEPENDENT_AMBULATORY_CARE_PROVIDER_SITE_OTHER): Payer: Self-pay

## 2012-03-13 VITALS — BP 130/88 | HR 83 | Temp 97.1°F | Ht 65.0 in | Wt 219.0 lb

## 2012-03-13 DIAGNOSIS — K5000 Crohn's disease of small intestine without complications: Secondary | ICD-10-CM

## 2012-03-13 DIAGNOSIS — E785 Hyperlipidemia, unspecified: Secondary | ICD-10-CM

## 2012-03-13 DIAGNOSIS — F411 Generalized anxiety disorder: Secondary | ICD-10-CM

## 2012-03-13 DIAGNOSIS — Z23 Encounter for immunization: Secondary | ICD-10-CM

## 2012-03-13 DIAGNOSIS — E119 Type 2 diabetes mellitus without complications: Secondary | ICD-10-CM

## 2012-03-13 DIAGNOSIS — E669 Obesity, unspecified: Secondary | ICD-10-CM

## 2012-03-13 DIAGNOSIS — F419 Anxiety disorder, unspecified: Secondary | ICD-10-CM

## 2012-03-13 DIAGNOSIS — K449 Diaphragmatic hernia without obstruction or gangrene: Secondary | ICD-10-CM

## 2012-03-13 LAB — CBC WITH DIFFERENTIAL/PLATELET
Basophils Absolute: 0.1 10*3/uL (ref 0.0–0.1)
Eosinophils Relative: 2.1 % (ref 0.0–5.0)
Monocytes Absolute: 0.5 10*3/uL (ref 0.1–1.0)
Monocytes Relative: 4.8 % (ref 3.0–12.0)
Neutrophils Relative %: 55.6 % (ref 43.0–77.0)
Platelets: 326 10*3/uL (ref 150.0–400.0)
RDW: 12.6 % (ref 11.5–14.6)
WBC: 10 10*3/uL (ref 4.5–10.5)

## 2012-03-13 LAB — BASIC METABOLIC PANEL
BUN: 13 mg/dL (ref 6–23)
CO2: 25 mEq/L (ref 19–32)
Chloride: 102 mEq/L (ref 96–112)
Glucose, Bld: 146 mg/dL — ABNORMAL HIGH (ref 70–99)
Potassium: 3.9 mEq/L (ref 3.5–5.1)

## 2012-03-13 LAB — HEPATIC FUNCTION PANEL
ALT: 81 U/L — ABNORMAL HIGH (ref 0–35)
AST: 45 U/L — ABNORMAL HIGH (ref 0–37)
Albumin: 4.2 g/dL (ref 3.5–5.2)
Alkaline Phosphatase: 60 U/L (ref 39–117)
Total Protein: 7.8 g/dL (ref 6.0–8.3)

## 2012-03-13 LAB — TSH: TSH: 1.47 u[IU]/mL (ref 0.35–5.50)

## 2012-03-13 LAB — LIPID PANEL: HDL: 30.7 mg/dL — ABNORMAL LOW (ref >39.00)

## 2012-03-13 LAB — HEMOGLOBIN A1C: Hgb A1c MFr Bld: 6.4 % (ref 4.6–6.5)

## 2012-03-13 NOTE — Patient Instructions (Addendum)
Today we updated your med list in our EPIC system...    Continue your current medications the same...  Today we did your follow up FASTING blood work...    We will contact you w/ the results when avail...  Let's get on track w/ our DIET & EXERCISE program...  Call for any questions...  Let's continue our 4 month foloow up visits (until we know you are under good control).Marland KitchenMarland Kitchen

## 2012-03-13 NOTE — Progress Notes (Signed)
Subjective:    Patient ID: Adrienne Goodwin, female    DOB: 1975/06/24, 36 y.o.   MRN: [id number]  HPI 25 y/o WF, daughter of Adrienne Goodwin, followed for general medical purposes>>  ~  June 14, 2010:  here to re-establish medical care after recent ER visit for blurry vision w/ dx of DM established w/ BS= 457... she was last seen in the [place] office in the 1990s by DrPatterson for Crohn's ileitis & by DrPlotnikov for general medical care... she stopped all medical follow up & gained weight up to 220# range until recently noted polyuria & polydipsia w/ blurred vision that lead her to her eye doctor & fingerstick BS> 400... she went to the [facility] ER 2/19 where BS=472, neg urine ketones, & she was started on GLIMEPIRIDE 2mg  Qam & told to f/u here... We discussed Diet, Exercise, & added Metformin 500mg  Bid.  ~  July 04, 2010:  3 week ROV recheck> tolerating Metformin two times a day & Glimep 2mg Qam> BS at home= much improved w/ FBS in the 130-140 range (down from 250 range); she's been to the [facility] w/ diet education but so far wt up 1-2#...    She is c/o some head & chest congestion w/ coupy cough, some CP from coughing but no F C S, no sputum, denies SOB etc> rec to take Mucinex 1-2 Bid w/ plenty of water, Rx Hydromet cough syrup, & ZPak for Prn use...  ~  August 16, 2010:  6wk ROV & doing much better> on diet but wt essent unchanged & not really exercising (states she's losing inches, not lbs);  We reviewed diet + exercise prescription & goal of 1 lb wt reduction per week;  Notes FBS in the 110-125 range now, had one hypoglycemic event at 4pm one day w/ BS 37 (we discussed staying away from sweets, use peanut butter or nabs);  We discussed wt watchers, going w/ a buddy, etc...  Plan ROV 6 wks w/ fasting blood work...  ~  October 12, 2010:  761mo ROV & she is feeling better but has gained 4# to 226# today ("I watch what I eat"); we reviewed diet & exercise program ("I visit Mom in the hosp") once  again; BS at home are good in the 120 range w/o hypoglycemia> NOTE: she decr the Gluimepiride herself to 1/2 of the 2mg  tab each AM; labs today showed BS=126, A1c=6.3 (OK to leave Glimep at 1mg /d)...  We checked CXR (clear, NAD), EKG (NSR, minNSSTTWA), Labs (see below)...  ~  January 12, 2011:  61mo ROV & she has finally lost some weight down 12# to 214# today on diet & exercise program; states accuchecks at home in the 100-120 range; LABS TODAY w/ BS 387 A1c 10.7 & I called [facility] on [facility]- last filled Metformin #60 & Glimep #30 on 09/19/10!!!    We read her the "riot act"> either get on meds regularly & take every day (+diet & exercise) so we can assess efficacy, or start insulin & refer to DM specialist...  ~  May 10, 2011:  25mo ROV & she has gained 11# up to 225#; states she's taking both meds [facility]) but not checking BS at home; requests flu shot today- ok...    Ex-smoker> she quit in 2011 & has remained quit; CXR 6/12 was clear, NAD...    Hyperlipid> on diet alone for now, prev FLP 6/12 wasn't good & she is committed to diet, exercise, etc..Marland Kitchen  DM> on Metform500Bid & Glimep2mg AM; labs today showed BS=284, A1c=8.8; rec increase both Metform to 2tabsBid & Glimep 4mg  Qam...    Obesity> wt back up 11# to 225# & we reviewed diet, exercise, & wt reduction strategies...    GI> HH, Hx Crohn's> she is c/o some reflux symptoms, heartburn, etc and treating self w/ Tums; we discussed further eval but she declines (no insurance) & decided to Rx w/ OMEPRAZOLE 40mg /d taken 30 min before the 1st meal of the day.  ~  August 08, 2011:  29mo ROV & Adrienne Goodwin has gained 3# to 229# today & we reviewed diet, exercise & wt reduction program; she cannot afford dietary consult (she has no insurance) & bariatric surg is out of the question; I suggested that she call & look into medicaid...  BS at home when checked 2Hpc is around 160 she says> fingerstick BS= 215, and A1c=7.9.Marland KitchenMarland Kitchen  See prob list  below>>  ~  November 07, 2011:  29mo ROV & Adrienne Goodwin has lost 8# in the interim, taking her Metform&Glimep regularly, and she notes that recent post prandial BS have been 130-160 range... She has been walking some, but there id room for improvement she admits; no new complaints or concerns;  Unfortunately her f/u labs today showed BS= 213 and A1c= 9.2;  She cannot afford, and will not purchase Gliptins or Tzd meds;  Neither can she afford long acting insulin Rx like Lantus or Levemir;  Next step will be either NPH or 70/30 mixed insulin, but for now I'm asking her to improve diet, exercise, & double the glimepiride to 4mg Bid...    We reviewed prob list, meds, xrays and labs> see below>>   ~  March 13, 2012:  42mo ROV & Adrienne Goodwin has only lost 2# down to 219# today; states she watches what she eats (no sweets, no soft drinks) & has been to the dietician; she is walking some at home but not in a regular exercise program 7 we discussed this AGAIN about DIET & EXERCISE...  She notes that he sugars have been improved at home & today measures BS=146 & A1c=6.4 (much improved, great work)...    We reviewed prob list, meds, xrays and labs> see below for updates >>  LABS 11/13:  FLP- not at goals on diet alone & TG=454;  Chems- ok x BS=146, A1c=6.4, SGOT=45, SGPT=81;  CBC- wnl;  TSH=1.47          Problem List:    Hx of SINUSITIS (ICD-473.9) - s/p deviated septum surg 1991 by DrLawrence... she has prev allergy testing by DrESL in 1996 w/ +reactions to mold & dust... ~  4/13:  Springtime allergies acting up, using OTC antihist & given samples of Nasonex for prn use...  CIGARETTE SMOKER (ICD-305.1) - she quit smoking in 2011 when her father had MI... ~  CXR 6/12 showed clear lungs, NAD... ~  EKG 6/12 showed NSR, minor NSSTTWA... ~  She denies cough, sput, hemoptysis, CP, palpit, SOB, edema; advised exercise program...  HYPERLIPIDEMIA>  On diet alone (low chol, low fat), if FLP not trending better then we will  start meds... ~  FLP 6/12 on diet alone showed TChol 241, TG 289, HDL 37, LDL 166... We decided on diet & exercise to start... ~  4/13 & 7/13: here for afternoon appt & we discussed f/u FLP on ret fasting in AM... ~  FLP 11/13 on diet showed TChol 207, TG 454, HDL 31, LDL 113... Needs better diet, wt reduction, &  start FENOFIBRATE 160mg /d.  DIABETES MELLITUS (ICD-250.00) - on GLIMEPIRIDE 4mg Bid & METFORMIN 500mg - 2Bid... ~  NEW PT labs 2/12 showed BS= 472, A1c= 9.9.Marland KitchenMarland Kitchen started Metform500Bid & Glimep2mg /d. ~  4/12:  She notes FBS down to 110-125 range, but hasn't lost any wt & we discussed this... ~  Labs 6/12 showed BS= 126, A1c= 6.3, Umicroalb= neg... Great job, now get wt down; she decr the [employer]- 1/2 tab Qam on her own. ~  9/12: Wt down 12# to 214#; states on meds regularly (Metform500Bid & Glim2mg -1/2am), BS=387 & A1c=10.7, Pharm says last refilled 30d supplies 09/19/10!!! ~  1/13: Wt back up 11# to 225#; on Metform500Bid & Glimep2mg /d confirmed by Pharm> BS=284, A1c=8.8; REC> incr Metform1000Bid & Glimep4mg /d. ~  Labs 4/13 on Metform2Bid+Glimep4 showed FSBS= 215, A1c= 7.9; we reviewed options & cost of additional meds; SHE MUST GET WT DOWN!!! ~  Labs 7/13 on Metform500-2Bid+Glim4 showed BS= 213, A1c= 9.2; Rec to incr Glim4mg Bid, get on diet, get wt down further... ~  Labs 11/13 on Metform2Bid+Glim4Bid showed BS= 146, A1c= 6.4.Marland KitchenMarland Kitchen Continue same.  OBESITY (ICD-278.00) - we disussed wt reduction & referral to [facility]... ~  review of paper chart shows weights 180-200 in late 90's  ~  weight 2/12 = 219# ==> 2 wk f/u recheck wt=221# & we reviewed the diet recommendations. ~  Weight 4/12 = 221# but she notes "I'm losing inches"... Discussed goal of losing 1 lb every week. ~  Weight 6/12 = 226# & we discussed need for a REAL diet & exercise program. ~  Weight 9/12 = 214# ~  Weight 1/13 = 225#... We reviewed diet, exercise, wt reduction program. ~  Weight 4/13 = 229# ~  Weight 7/13 = 221# ~   Weight 11/13 = 219#  HIATAL HERNIA (ICD-553.3) - she had EGD by DrPatterson 1999 w/ sm HH, otherw neg... ~  AbdSonar 1999 showed 2 sm GB polyps, no stones, no inflamm etc... ~  1/13:  She is c/o reflux symptoms but declines GI referral; we discussed trial OMEPRAZOLE 40mg /d, antireflux regimen etc...  ?Hx of CROHN'S DISEASE, SMALL INTESTINE (ICD-555.0) - followed by DrPatterson & last colonoscopy was 1999 showing normal terminal ileum w/o signs of inflamm bowel dis... he felt that she prob had IBS and she was given trial Librax... ~  CT Abd/ Pelvis in 1995 was neg x for complex right ovarian cyst (referred to DrSSmith). ~  note: she had a neg colonoscopy 1995 by DrPerry...  BORDERLINE LFTs> likely FATTY LIVER DISEASE> we discussed this condition & the need to lose weight. ~  Labs 6/12 showed SGOT= 40, SGPT= 55 ~  Labs 11/13 showed SGOT= 45, SGPT= 81... Must diet, get wt down!!!  Hx of OVARIAN CYST (ICD-620.2) - prev GYN eval by DrSigSmith  Hx of DERMATITIS (ICD-692.9) PERSONAL HISTORY OF ALLERGY TO LATEX (ICD-V15.07) - sent to [facility] for eval in 1997.  HEALTH MAINTENANCE >> ~  DM:  Needs DM eye exam & advised to sched at her convenience;  Monofilament test is normal... ~  GI:  Followed by DrPatterson w/ prob IBS, neg colonoscopy 1999, now 36y/o & no GI symptoms... ~  GYN:  It's been >16yrs since her last GYN check & PAP, advised to sched at her convenience... ~  Immuniz:  Advised to get yearly seasonal flu vaccine each fall;  Needs baseline TDAP (wants to wait);  Needs baseline Pneumovax as well (Diabetic).   Past Surgical History  Procedure Date  . Tonsillectomy and adenoidectomy   .  Nasal septum surgery     Outpatient Encounter Prescriptions as of 03/13/2012  Medication Sig Dispense Refill  . diphenhydrAMINE (SOMINEX) 25 MG tablet Take 25 mg by mouth at bedtime as needed.      Marland Kitchen glimepiride (AMARYL) 4 MG tablet Take 1 tablet (4 mg total) by mouth 2 (two) times daily.  180 tablet   3  . glucose blood test strip Test daily as needed  100 each  12  . guaiFENesin (MUCINEX) 600 MG 12 hr tablet Take 1,200 mg by mouth 2 (two) times daily as needed.       . metFORMIN (GLUCOPHAGE) 500 MG tablet Take 2 tablets (1,000 mg total) by mouth 2 (two) times daily with a meal.  360 tablet  3    Allergies  Allergen Reactions  . Latex     REACTION: rash    Current Medications, Allergies, Past Medical History, Past Surgical History, Family History, and Social History were reviewed in [employer] record.     Review of Systems         See HPI - all other systems neg except as noted... The patient complains of hoarseness and prolonged cough.  The patient denies anorexia, fever, weight loss, weight gain, vision loss, decreased hearing, chest pain, syncope, dyspnea on exertion, peripheral edema, headaches, hemoptysis, abdominal pain, melena, hematochezia, severe indigestion/heartburn, hematuria, incontinence, muscle weakness, suspicious skin lesions, transient blindness, difficulty walking, depression, unusual weight change, abnormal bleeding, enlarged lymph nodes, and angioedema.     Objective:   Physical Exam     WD, Obese, 36 y/o WF in NAD... she is somewhat anxious... GENERAL:  Alert & oriented; pleasant & cooperative... HEENT:  [place]/AT, EOM-wnl, PERRLA, Fundi-benign, EACs-clear, TMs-wnl, NOSE-clear, THROAT-clear & wnl. NECK:  Supple w/ full ROM; no JVD; normal carotid impulses w/o bruits; no thyromegaly or nodules palpated; no lymphadenopathy. CHEST:  Croupy cough but chest clear to P & A; without wheezes/ rales/ or rhonchi heard... HEART:  Regular Rhythm; without murmurs/ rubs/ or gallops. ABDOMEN:  Soft & nontender; normal bowel sounds; no organomegaly or masses detected. EXT: without deformities or arthritic changes; no varicose veins/ venous insuffic/ or edema. NEURO:  CN's intact; motor testing normal; sensory testing normal; gait normal & balance  OK. DERM:  No lesions noted; no rash etc...  RADIOLOGY DATA:  Reviewed in the EPIC EMR & discussed w/ the patient...    LABORATORY DATA:  Reviewed in the EPIC EMR & discussed w/ the patient...   Assessment & Plan:    DM>  Weight is trending down  & we reviewed diet, exercise & wt reduction strategies;  On Glimep4Bid & Meform1000Bid> A1c is much improved to 6.4.Marland KitchenMarland Kitchen   Ex-smoker>  She has remained quit & congratulated on this accomplishment!...  CHOL>  Her FLP is abn & all parameters are off; needs better low fat diet, wt loss & start [id number]...  OBESITY>  Everything hinges upon her losing weight & she needs to be serious about diet & exercise; she has no insurance & can't afford dietary counseling or Bariatric approach...  HH>  She notes new onset reflux symptoms; she declines GI referral or further eval w/ EGD etc; we discussed Rx w/ OMEP40mg /d & careful f/u...  Crohn's>  Stable w/o abd pain, N V D or blood seen...  Fatty Liver dis>  LFTs are sl elev & we reviewed the need for wt reduction etc...  Other medical issues as noted... Needs Pneumovax, TDAP, GYN check, etc but she wants  to wait- no insurance & asked to look into medicaid...   Patient's Medications  New Prescriptions   No medications on file  Previous Medications   DIPHENHYDRAMINE (SOMINEX) 25 MG TABLET    Take 25 mg by mouth at bedtime as needed.   GLIMEPIRIDE (AMARYL) 4 MG TABLET    Take 1 tablet (4 mg total) by mouth 2 (two) times daily.   GLUCOSE BLOOD TEST STRIP    Test daily as needed   GUAIFENESIN (MUCINEX) 600 MG 12 HR TABLET    Take 1,200 mg by mouth 2 (two) times daily as needed.    METFORMIN (GLUCOPHAGE) 500 MG TABLET    Take 2 tablets (1,000 mg total) by mouth 2 (two) times daily with a meal.  Modified Medications   No medications on file  Discontinued Medications   No medications on file

## 2012-03-15 ENCOUNTER — Telehealth: Payer: Self-pay | Admitting: Pulmonary Disease

## 2012-03-15 MED ORDER — FENOFIBRATE 160 MG PO TABS: 160.0000 mg | ORAL_TABLET | Freq: Every day | ORAL | Status: DC

## 2012-03-15 NOTE — Telephone Encounter (Signed)
Called and spoke with pt and i have reviewed her lab results with her since she is not able to get on the computer at home to check the results.  Pt is aware of her lab results and new medication fenofibrate that has been sent to her pharmacy.  Nothing further is needed.

## 2012-03-26 ENCOUNTER — Telehealth: Payer: Self-pay | Admitting: Pulmonary Disease

## 2012-03-26 NOTE — Telephone Encounter (Signed)
Called and spoke with pt and she stated  The fen[facility]ate was too expensive at [facility]--was going to cost her $68 for a 30 day supply.  Pt is requesting an alternative.   SN please advise. Thanks  Allergies  Allergen Reactions  . Latex     REACTION: rash

## 2012-03-27 ENCOUNTER — Telehealth: Payer: Self-pay | Admitting: Pulmonary Disease

## 2012-03-27 MED ORDER — GEMFIBROZIL 600 MG PO TABS: 600.0000 mg | ORAL_TABLET | Freq: Two times a day (BID) | ORAL | Status: DC

## 2012-03-27 NOTE — Telephone Encounter (Signed)
Called and spoke with pt and she is aware that we are not able to find the fenofibrate any cheaper at any other pharamcy.  Per SN---ok to change to gemfibrozil 600 mg  1 po bid  #60 with 11 refills.  i called and spoke with pt and she is aware that this has been sent to [facility] and she will call to see how much this will be.  Nothing further is needed.

## 2012-03-27 NOTE — Telephone Encounter (Signed)
rx has been sent to [facility] on [address] per pts request.

## 2012-03-28 NOTE — Telephone Encounter (Signed)
error 

## 2012-03-29 ENCOUNTER — Telehealth: Payer: Self-pay | Admitting: Pulmonary Disease

## 2012-03-29 MED ORDER — GEMFIBROZIL 600 MG PO TABS: 600.0000 mg | ORAL_TABLET | Freq: Two times a day (BID) | ORAL | Status: DC

## 2012-03-29 NOTE — Telephone Encounter (Signed)
Called and spoke with pt and she stated that she would like to try [facility] for the gemfibrozil.  Pt is aware that i have sent this to [facility] and they will contact her about payment, etc.  Nothing further is needed.

## 2012-06-20 ENCOUNTER — Other Ambulatory Visit: Payer: Self-pay | Admitting: Pulmonary Disease

## 2012-06-26 ENCOUNTER — Other Ambulatory Visit: Payer: Self-pay | Admitting: Pulmonary Disease

## 2012-06-27 ENCOUNTER — Telehealth: Payer: Self-pay | Admitting: Pulmonary Disease

## 2012-06-27 NOTE — Telephone Encounter (Signed)
RX was [facility]. I called pt and she stated when she went to the pharmacy they already had this filled for her and needed nothing further

## 2012-07-11 ENCOUNTER — Other Ambulatory Visit (INDEPENDENT_AMBULATORY_CARE_PROVIDER_SITE_OTHER): Payer: Self-pay

## 2012-07-11 ENCOUNTER — Encounter: Payer: Self-pay | Admitting: Pulmonary Disease

## 2012-07-11 ENCOUNTER — Ambulatory Visit (INDEPENDENT_AMBULATORY_CARE_PROVIDER_SITE_OTHER): Payer: Self-pay | Admitting: Pulmonary Disease

## 2012-07-11 VITALS — BP 110/82 | HR 96 | Temp 98.1°F | Ht 65.0 in | Wt 219.6 lb

## 2012-07-11 DIAGNOSIS — K5000 Crohn's disease of small intestine without complications: Secondary | ICD-10-CM

## 2012-07-11 DIAGNOSIS — E119 Type 2 diabetes mellitus without complications: Secondary | ICD-10-CM

## 2012-07-11 DIAGNOSIS — L259 Unspecified contact dermatitis, unspecified cause: Secondary | ICD-10-CM

## 2012-07-11 DIAGNOSIS — F17200 Nicotine dependence, unspecified, uncomplicated: Secondary | ICD-10-CM

## 2012-07-11 DIAGNOSIS — E669 Obesity, unspecified: Secondary | ICD-10-CM

## 2012-07-11 DIAGNOSIS — E785 Hyperlipidemia, unspecified: Secondary | ICD-10-CM

## 2012-07-11 DIAGNOSIS — K449 Diaphragmatic hernia without obstruction or gangrene: Secondary | ICD-10-CM

## 2012-07-11 LAB — BASIC METABOLIC PANEL
BUN: 10 mg/dL (ref 6–23)
Creatinine, Ser: 0.8 mg/dL (ref 0.4–1.2)
GFR: 86.1 mL/min (ref >60.00)
Glucose, Bld: 159 mg/dL — ABNORMAL HIGH (ref 70–99)
Potassium: 4.1 mEq/L (ref 3.5–5.1)

## 2012-07-11 NOTE — Progress Notes (Signed)
Subjective:    Patient ID: Adrienne Goodwin, female    DOB: Mar 18, 1976, 37 y.o.   MRN: [id number]  HPI 29 y/o WF, daughter of Quanta Robertshaw, followed for general medical purposes>>  ~  May 10, 2011:  59mo ROV & she has gained 11# up to 225#; states she's taking both meds regularly ([facility] refills) but not checking BS at home; requests flu shot today- ok...    Ex-smoker> she quit in 2011 & has remained quit; CXR 6/12 was clear, NAD...    Hyperlipid> on diet alone for now, prev FLP 6/12 wasn't good & she is committed to diet, exercise, etc...    DM> on Metform500Bid & Glimep2mg AM; labs today showed BS=284, A1c=8.8; rec increase both Metform to 2tabsBid & Glimep 4mg  Qam...    Obesity> wt back up 11# to 225# & we reviewed diet, exercise, & wt reduction strategies...    GI> HH, Hx Crohn's> she is c/o some reflux symptoms, heartburn, etc and treating self w/ Tums; we discussed further eval but she declines (no insurance) & decided to Rx w/ OMEPRAZOLE 40mg /d taken 30 min before the 1st meal of the day.  ~  August 08, 2011:  67mo ROV & Adrienne Goodwin has gained 3# to 229# today & we reviewed diet, exercise & wt reduction program; she cannot afford dietary consult (she has no insurance) & bariatric surg is out of the question; I suggested that she call & look into medicaid...  BS at home when checked 2Hpc is around 160 she says> fingerstick BS= 215, and A1c=7.9.Marland KitchenMarland Kitchen  See prob list below>>  ~  November 07, 2011:  67mo ROV & Adrienne Goodwin has lost 8# in the interim, taking her Metform&Glimep regularly, and she notes that recent post prandial BS have been 130-160 range... She has been walking some, but there id room for improvement she admits; no new complaints or concerns;  Unfortunately her f/u labs today showed BS= 213 and A1c= 9.2;  She cannot afford, and will not purchase Gliptins or Tzd meds;  Neither can she afford long acting insulin Rx like Lantus or Levemir;  Next step will be either NPH or 70/30 mixed insulin, but  for now I'm asking her to improve diet, exercise, & double the glimepiride to 4mg Bid...    We reviewed prob list, meds, xrays and labs> see below>>   ~  March 13, 2012:  59mo ROV & Adrienne Goodwin has only lost 2# down to 219# today; states she watches what she eats (no sweets, no soft drinks) & has been to the dietician; she is walking some at home but not in a regular exercise program 7 we discussed this AGAIN about DIET & EXERCISE...  She notes that he sugars have been improved at home & today measures BS=146 & A1c=6.4 (much improved, great work)...    We reviewed prob list, meds, xrays and labs> see below for updates >>  LABS 11/13:  FLP- not at goals on diet alone & TG=454;  Chems- ok x BS=146, A1c=6.4, SGOT=45, SGPT=81;  CBC- wnl;  TSH=1.47  ~  July 11, 2012:  59mo ROV & Adrienne Goodwin is doing satis- feels well, no new complaints or concerns; she weighs 220# which is unchanged & mother says she is not trying very hard- we had a frank conversation about diet/ exercise/ wt reduction; she states her BS are all good at home...  We reviewed the following medical problems during today's office visit >>     Ex-smoker> she quit in 2011 &  has remained quit; she denies cough, sput, hemoptysis, CP, SOB, etc;  CXR 6/12 was clear, NAD...    Hyperlipid> on Gemfib600Bid now; last FLP 11/13 showed TChol 207, TG 454, HDL 31, LDL 113 on diet alone & Lopid started; tol well, hasn't lost any wt...    DM> on Metform500-2Bid & Glimep4Bid; labs today showed BS=159, A1c=6.7; same meds but MUST get the weight down...    Obesity> wt is unchanged at 220# & we reviewed diet, exercise, & wt reduction strategies...    GI> HH, Hx Crohn's> she is off prev Omep40 & uses OTC Prilosec20 as needed; followed by [employer]; denies abd pain, dysphagia, n/v, c/d, blood seen...    Elev LFTs> suspect FLD as she denies Etoh etc; no prev imaging studies at her request due to lack of insurance; she understands the need to lose weight.    Hx  Ovarian Cyst> her Gyn was DrSigSmith but she needs a new gyn since he passed away in 09/13/10... We reviewed prob list, meds, xrays and labs> see below for updates >> she had the 2011/09/13 Flu vaccine 11/13... LABS 3/14:  Chems- ok w BS=159, A1c=6.7          Problem List:    Hx of SINUSITIS (ICD-473.9) - s/p deviated septum surg 1991 by DrLawrence... she has prev allergy testing by DrESL in Sep 13, 1994 w/ +reactions to mold & dust... ~  4/13:  Springtime allergies acting up, using OTC antihist & given samples of Nasonex for prn use...  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 ~  CXR 6/12 showed clear lungs, NAD... ~  EKG 6/12 showed NSR, minor NSSTTWA... ~  She denies cough, sput, hemoptysis, CP, palpit, SOB, edema; advised exercise program...  HYPERLIPIDEMIA>  Prev on diet alone (low chol, low fat)=> placed on LOPID 600mg Bid  ~  FLP 6/12 on diet alone showed TChol 241, TG 289, HDL 37, LDL 166... We decided on diet & exercise to start... ~  4/13 & 7/13: here for afternoon appt & we discussed f/u FLP on ret fasting in AM... ~  FLP 11/13 on diet showed TChol 207, TG 454, HDL 31, LDL 113... Needs better diet, wt reduction, & start GEMFIBROZIL 600mg Bid.  DIABETES MELLITUS (ICD-250.00) - on GLIMEPIRIDE 4mg Bid & METFORMIN 500mg - 2Bid... ~  NEW PT labs 2/12 showed BS= 472, A1c= 9.9.Marland KitchenMarland Kitchen started Metform500Bid & Glimep2mg /d. ~  4/12:  She notes FBS down to 110-125 range, but hasn't lost any wt & we discussed this... ~  Labs 6/12 showed BS= 126, A1c= 6.3, Umicroalb= neg... Great job, now get wt down; she decr the [employer]- 1/2 tab Qam on her own. ~  9/12: Wt down 12# to 214#; states on meds regularly (Metform500Bid & Glim2mg -1/2am), BS=387 & A1c=10.7, Pharm says last refilled 30d supplies 09/19/10!!! ~  1/13: Wt back up 11# to 225#; on Metform500Bid & Glimep2mg /d confirmed by Pharm> BS=284, A1c=8.8; REC> incr Metform1000Bid & Glimep4mg /d. ~  Labs 4/13 on Metform2Bid+Glimep4  showed FSBS= 215, A1c= 7.9; we reviewed options & cost of additional meds; SHE MUST GET WT DOWN!!! ~  Labs 7/13 on Metform500-2Bid+Glim4 showed BS= 213, A1c= 9.2; Rec to incr Glim4mg Bid, get on diet, get wt down further... ~  Labs 11/13 on Metform2Bid+Glim4Bid showed BS= 146, A1c= 6.4.Marland KitchenMarland Kitchen Continue same. ~  Labs 3/14 on Metform2Bid+Glim4Bid showed BS= 159, A1c= 6.7.Marland KitchenMarland Kitchen Still hasn't lost any wt & we reviewed diet, exercise, wt reduction...  OBESITY (ICD-278.00) - we disussed wt reduction &  referral to [facility]... ~  review of paper chart shows weights 180-200 in late 90's  ~  weight 2/12 = 219# ==> 2 wk f/u recheck wt=221# & we reviewed the diet recommendations. ~  Weight 4/12 = 221# but she notes "I'm losing inches"... Discussed goal of losing 1 lb every week. ~  Weight 6/12 = 226# & we discussed need for a REAL diet & exercise program. ~  Weight 9/12 = 214# ~  Weight 1/13 = 225#... We reviewed diet, exercise, wt reduction program. ~  Weight 4/13 = 229# ~  Weight 7/13 = 221# ~  Weight 11/13 = 219# ~  Weight 3/14 = 220#  HIATAL HERNIA (ICD-553.3) - she had EGD by DrPatterson 1999 w/ sm HH, otherw neg... ~  AbdSonar 1999 showed 2 sm GB polyps, no stones, no inflamm etc... ~  1/13:  She is c/o reflux symptoms but declines GI referral; we discussed trial OMEPRAZOLE 40mg /d, antireflux regimen etc...  ?Hx of CROHN'S DISEASE, SMALL INTESTINE (ICD-555.0) - followed by DrPatterson & last colonoscopy was 1999 showing normal terminal ileum w/o signs of inflamm bowel dis... he felt that she prob had IBS and she was given trial Librax... ~  CT Abd/ Pelvis in 1995 was neg x for complex right ovarian cyst (referred to DrSSmith). ~  note: she had a neg colonoscopy 1995 by DrPerry...  BORDERLINE LFTs> likely FATTY LIVER DISEASE> we discussed this condition & the need to lose weight. ~  Labs 6/12 showed SGOT= 40, SGPT= 55 ~  Labs 11/13 showed SGOT= 45, SGPT= 81... Must diet, get wt down!!!  Hx of  OVARIAN CYST (ICD-620.2) - prev GYN eval by DrSigSmith  Hx of DERMATITIS (ICD-692.9) PERSONAL HISTORY OF ALLERGY TO LATEX (ICD-V15.07) - sent to [facility] for eval in 1997.  HEALTH MAINTENANCE >> ~  DM:  Needs DM eye exam & advised to sched at her convenience;  Monofilament test is normal... ~  GI:  Followed by DrPatterson w/ prob IBS, neg colonoscopy 1999, now 36y/o & no GI symptoms... ~  GYN:  It's been >74yrs since her last GYN check & PAP, advised to sched at her convenience... ~  Immuniz:  Advised to get yearly seasonal flu vaccine each fall;  Needs baseline TDAP (wants to wait);  Needs baseline Pneumovax as well (Diabetic).   Past Surgical History  Procedure Laterality Date  . Tonsillectomy and adenoidectomy    . Nasal septum surgery      Outpatient Encounter Prescriptions as of 07/11/2012  Medication Sig Dispense Refill  . diphenhydrAMINE (SOMINEX) 25 MG tablet Take 25 mg by mouth at bedtime as needed.      Marland Kitchen gemfibrozil (LOPID) 600 MG tablet Take 1 tablet (600 mg total) by mouth 2 (two) times daily before a meal.  60 tablet  11  . glimepiride (AMARYL) 4 MG tablet Take 1 tablet (4 mg total) by mouth 2 (two) times daily.  180 tablet  3  . glucose blood test strip Test daily as needed  100 each  12  . guaiFENesin (MUCINEX) 600 MG 12 hr tablet Take 1,200 mg by mouth 2 (two) times daily as needed.       . metFORMIN (GLUCOPHAGE) 500 MG tablet TAKE 2 TABLETS (1,000 MG TOTAL) BY MOUTH 2 (TWO) TIMES DAILY WITH A MEAL.  360 tablet  3  . [DISCONTINUED] metFORMIN (GLUCOPHAGE) 500 MG tablet TAKE 2 TABLETS (1,000 MG TOTAL) BY MOUTH 2 (TWO) TIMES DAILY WITH A MEAL.  360 tablet  3   No facility-administered encounter medications on file as of 07/11/2012.    Allergies  Allergen Reactions  . Latex     REACTION: rash    Current Medications, Allergies, Past Medical History, Past Surgical History, Family History, and Social History were reviewed in [employer] record.      Review of Systems         See HPI - all other systems neg except as noted... The patient complains of hoarseness and prolonged cough.  The patient denies anorexia, fever, weight loss, weight gain, vision loss, decreased hearing, chest pain, syncope, dyspnea on exertion, peripheral edema, headaches, hemoptysis, abdominal pain, melena, hematochezia, severe indigestion/heartburn, hematuria, incontinence, muscle weakness, suspicious skin lesions, transient blindness, difficulty walking, depression, unusual weight change, abnormal bleeding, enlarged lymph nodes, and angioedema.     Objective:   Physical Exam     WD, Obese, 37 y/o WF in NAD... she is somewhat anxious... GENERAL:  Alert & oriented; pleasant & cooperative... HEENT:  [place]/AT, EOM-wnl, PERRLA, Fundi-benign, EACs-clear, TMs-wnl, NOSE-clear, THROAT-clear & wnl. NECK:  Supple w/ full ROM; no JVD; normal carotid impulses w/o bruits; no thyromegaly or nodules palpated; no lymphadenopathy. CHEST:  Croupy cough but chest clear to P & A; without wheezes/ rales/ or rhonchi heard... HEART:  Regular Rhythm; without murmurs/ rubs/ or gallops. ABDOMEN:  Soft & nontender; normal bowel sounds; no organomegaly or masses detected. EXT: without deformities or arthritic changes; no varicose veins/ venous insuffic/ or edema. NEURO:  CN's intact; motor testing normal; sensory testing normal; gait normal & balance OK. DERM:  No lesions noted; no rash etc...  RADIOLOGY DATA:  Reviewed in the EPIC EMR & discussed w/ the patient...    LABORATORY DATA:  Reviewed in the EPIC EMR & discussed w/ the patient...   Assessment & Plan:    DM>  Weight is unchanged & we reviewed diet, exercise & wt reduction strategies;  On Glimep4Bid & Meform1000Bid> A1c is holding in the 6's...   Ex-smoker>  She has remained quit & congratulated on this accomplishment!...  CHOL>  Her FLP is abn & all parameters are off; needs better low fat diet, wt loss & continue the  Gemfib600Bid...  OBESITY>  Everything hinges upon her losing weight & she needs to be serious about diet & exercise; she has no insurance & can't afford dietary counseling or Bariatric approach...  HH>  She notes improved GI symptoms & denies GERD, dysphagia, etc...  Crohn's>  Stable w/o abd pain, N V D or blood seen...  Fatty Liver dis>  LFTs are sl elev & we reviewed the need for wt reduction etc...  Other medical issues as noted... Needs Pneumovax, TDAP, GYN check, etc but she wants to wait- no insurance & asked to look into medicaid...   Patient's Medications  New Prescriptions   No medications on file  Previous Medications   DIPHENHYDRAMINE (SOMINEX) 25 MG TABLET    Take 25 mg by mouth at bedtime as needed.   GEMFIBROZIL (LOPID) 600 MG TABLET    Take 1 tablet (600 mg total) by mouth 2 (two) times daily before a meal.   GLIMEPIRIDE (AMARYL) 4 MG TABLET    Take 1 tablet (4 mg total) by mouth 2 (two) times daily.   GLUCOSE BLOOD TEST STRIP    Test daily as needed   GUAIFENESIN (MUCINEX) 600 MG 12 HR TABLET    Take 1,200 mg by mouth 2 (two) times daily as needed.    METFORMIN (  GLUCOPHAGE) 500 MG TABLET    TAKE 2 TABLETS (1,000 MG TOTAL) BY MOUTH 2 (TWO) TIMES DAILY WITH A MEAL.  Modified Medications   No medications on file  Discontinued Medications   METFORMIN (GLUCOPHAGE) 500 MG TABLET    TAKE 2 TABLETS (1,000 MG TOTAL) BY MOUTH 2 (TWO) TIMES DAILY WITH A MEAL.

## 2012-07-11 NOTE — Patient Instructions (Addendum)
Today we updated your med list in our EPIC system...    Continue your current medications the same...  Today we rechecked your Diabetic labs...    We will contact you w/ the results when available...   Let's get on track w/ our diet & exercise program...    You have GOT to get the wt down...  Call for any questions...  Let's plan a follow up visit in 6 months w/ FASTING blood work at that time.Marland KitchenMarland Kitchen

## 2012-12-20 ENCOUNTER — Other Ambulatory Visit: Payer: Self-pay | Admitting: Pulmonary Disease

## 2013-01-14 ENCOUNTER — Other Ambulatory Visit (INDEPENDENT_AMBULATORY_CARE_PROVIDER_SITE_OTHER): Payer: Self-pay

## 2013-01-14 ENCOUNTER — Encounter: Payer: Self-pay | Admitting: Pulmonary Disease

## 2013-01-14 ENCOUNTER — Ambulatory Visit (INDEPENDENT_AMBULATORY_CARE_PROVIDER_SITE_OTHER)
Admission: RE | Admit: 2013-01-14 | Discharge: 2013-01-14 | Disposition: A | Discharge status: Home / Self Care | Payer: Self-pay | Source: Ambulatory Visit | Attending: Pulmonary Disease | Admitting: Pulmonary Disease

## 2013-01-14 ENCOUNTER — Ambulatory Visit (INDEPENDENT_AMBULATORY_CARE_PROVIDER_SITE_OTHER): Payer: Self-pay | Admitting: Pulmonary Disease

## 2013-01-14 VITALS — BP 122/80 | HR 112 | Temp 97.4°F | Ht 65.0 in | Wt 205.2 lb

## 2013-01-14 DIAGNOSIS — L259 Unspecified contact dermatitis, unspecified cause: Secondary | ICD-10-CM

## 2013-01-14 DIAGNOSIS — Z23 Encounter for immunization: Secondary | ICD-10-CM

## 2013-01-14 DIAGNOSIS — E785 Hyperlipidemia, unspecified: Secondary | ICD-10-CM

## 2013-01-14 DIAGNOSIS — K5000 Crohn's disease of small intestine without complications: Secondary | ICD-10-CM

## 2013-01-14 DIAGNOSIS — K449 Diaphragmatic hernia without obstruction or gangrene: Secondary | ICD-10-CM

## 2013-01-14 DIAGNOSIS — Z0000 Encounter for general adult medical examination without abnormal findings: Secondary | ICD-10-CM

## 2013-01-14 DIAGNOSIS — E669 Obesity, unspecified: Secondary | ICD-10-CM

## 2013-01-14 DIAGNOSIS — E119 Type 2 diabetes mellitus without complications: Secondary | ICD-10-CM

## 2013-01-14 LAB — BASIC METABOLIC PANEL
BUN: 11 mg/dL (ref 6–23)
CO2: 25 mEq/L (ref 19–32)
Chloride: 99 mEq/L (ref 96–112)
GFR: 77.86 mL/min (ref >60.00)
Glucose, Bld: 339 mg/dL — ABNORMAL HIGH (ref 70–99)
Potassium: 4 mEq/L (ref 3.5–5.1)
Sodium: 134 mEq/L — ABNORMAL LOW (ref 135–145)

## 2013-01-14 LAB — MICROALBUMIN / CREATININE URINE RATIO
Creatinine,U: 139.9 mg/dL
Microalb Creat Ratio: 4.5 mg/g (ref 0.0–30.0)
Microalb, Ur: 6.3 mg/dL — ABNORMAL HIGH (ref 0.0–1.9)

## 2013-01-14 LAB — CBC WITH DIFFERENTIAL/PLATELET
Basophils Absolute: 0.1 10*3/uL (ref 0.0–0.1)
Basophils Relative: 1.2 % (ref 0.0–3.0)
Eosinophils Absolute: 0.2 10*3/uL (ref 0.0–0.7)
Eosinophils Relative: 1.8 % (ref 0.0–5.0)
HCT: 43.1 % (ref 36.0–46.0)
Hemoglobin: 15.1 g/dL — ABNORMAL HIGH (ref 12.0–15.0)
Lymphocytes Relative: 44.6 % (ref 12.0–46.0)
Lymphs Abs: 4.5 10*3/uL — ABNORMAL HIGH (ref 0.7–4.0)
MCHC: 35.1 g/dL (ref 30.0–36.0)
MCV: 90.5 fl (ref 78.0–100.0)
Monocytes Absolute: 0.5 10*3/uL (ref 0.1–1.0)
Monocytes Relative: 4.8 % (ref 3.0–12.0)
Neutro Abs: 4.8 10*3/uL (ref 1.4–7.7)
Neutrophils Relative %: 47.6 % (ref 43.0–77.0)
Platelets: 311 10*3/uL (ref 150.0–400.0)
RBC: 4.76 Mil/uL (ref 3.87–5.11)
RDW: 13.3 % (ref 11.5–14.6)
WBC: 10 10*3/uL (ref 4.5–10.5)

## 2013-01-14 LAB — TSH: TSH: 2.83 u[IU]/mL (ref 0.35–5.50)

## 2013-01-14 LAB — HEPATIC FUNCTION PANEL
ALT: 67 U/L — ABNORMAL HIGH (ref 0–35)
AST: 39 U/L — ABNORMAL HIGH (ref 0–37)
Albumin: 4.3 g/dL (ref 3.5–5.2)
Alkaline Phosphatase: 74 U/L (ref 39–117)
Bilirubin, Direct: 0 mg/dL (ref 0.0–0.3)
Total Bilirubin: 0.5 mg/dL (ref 0.3–1.2)
Total Protein: 7.6 g/dL (ref 6.0–8.3)

## 2013-01-14 LAB — LIPID PANEL
Cholesterol: 259 mg/dL — ABNORMAL HIGH (ref 0–200)
HDL: 32.8 mg/dL — ABNORMAL LOW
Total CHOL/HDL Ratio: 8
Triglycerides: 881 mg/dL — ABNORMAL HIGH (ref 0.0–149.0)
VLDL: 176.2 mg/dL — ABNORMAL HIGH (ref 0.0–40.0)

## 2013-01-14 LAB — HEMOGLOBIN A1C: Hgb A1c MFr Bld: 12 % — ABNORMAL HIGH (ref 4.6–6.5)

## 2013-01-14 LAB — LDL CHOLESTEROL, DIRECT: Direct LDL: 66.6 mg/dL

## 2013-01-14 NOTE — Patient Instructions (Addendum)
Today we updated your med list in our EPIC system...    Continue your current medications the same...  Today we did your follow up CXR, EKG, & FASTING blood work...    We will contact you w/ the results when available...   We also gave you the 2014 FLU vaccine...  Keep up the great job w/ diet, exercise, & wt reduction!!!  Call for any questions...  Let's plan a brief follow up visit in 82mo, sooner if needed for problems.Marland KitchenMarland Kitchen

## 2013-01-14 NOTE — Progress Notes (Signed)
Subjective:    Patient ID: Adrienne Goodwin, female    DOB: [birth year], 37 y.o.   MRN: [id number]  HPI 6 y/o WF, daughter of Vaniah Chambers, followed for general medical purposes>>  ~  May 10, 2011:  36mo ROV & she has gained 11# up to 225#; states she's taking both meds regularly ([facility] refills) but not checking BS at home; requests flu shot today- ok...    Ex-smoker> she quit in 2011 & has remained quit; CXR 6/12 was clear, NAD...    Hyperlipid> on diet alone for now, prev FLP 6/12 wasn't good & she is committed to diet, exercise, etc...    DM> on Metform500Bid & Glimep2mg AM; labs today showed BS=284, A1c=8.8; rec increase both Metform to 2tabsBid & Glimep 4mg  Qam...    Obesity> wt back up 11# to 225# & we reviewed diet, exercise, & wt reduction strategies...    GI> HH, Hx Crohn's> she is c/o some reflux symptoms, heartburn, etc and treating self w/ Tums; we discussed further eval but she declines (no insurance) & decided to Rx w/ OMEPRAZOLE 40mg /d taken 30 min before the 1st meal of the day.  ~  August 08, 2011:  40mo ROV & Adrienne Goodwin has gained 3# to 229# today & we reviewed diet, exercise & wt reduction program; she cannot afford dietary consult (she has no insurance) & bariatric surg is out of the question; I suggested that she call & look into medicaid...  BS at home when checked 2Hpc is around 160 she says> fingerstick BS= 215, and A1c=7.9.Marland KitchenMarland Kitchen  See prob list below>>  ~  November 07, 2011:  40mo ROV & Adrienne Goodwin has lost 8# in the interim, taking her Metform&Glimep regularly, and she notes that recent post prandial BS have been 130-160 range... She has been walking some, but there id room for improvement she admits; no new complaints or concerns;  Unfortunately her f/u labs today showed BS= 213 and A1c= 9.2;  She cannot afford, and will not purchase Gliptins or Tzd meds;  Neither can she afford long acting insulin Rx like Lantus or Levemir;  Next step will be either NPH or 70/30 mixed insulin, but  for now I'm asking her to improve diet, exercise, & double the glimepiride to 4mg Bid...    We reviewed prob list, meds, xrays and labs> see below>>   ~  March 13, 2012:  36mo ROV & Adrienne Goodwin has only lost 2# down to 219# today; states she watches what she eats (no sweets, no soft drinks) & has been to the dietician; she is walking some at home but not in a regular exercise program 7 we discussed this AGAIN about DIET & EXERCISE...  She notes that he sugars have been improved at home & today measures BS=146 & A1c=6.4 (much improved, great work)...    We reviewed prob list, meds, xrays and labs> see below for updates >>  LABS 11/13:  FLP- not at goals on diet alone & TG=454;  Chems- ok x BS=146, A1c=6.4, SGOT=45, SGPT=81;  CBC- wnl;  TSH=1.47  ~  July 11, 2012:  36mo ROV & Adrienne Goodwin is doing satis- feels well, no new complaints or concerns; she weighs 220# which is unchanged & mother says she is not trying very hard- we had a frank conversation about diet/ exercise/ wt reduction; she states her BS are all good at home...  We reviewed the following medical problems during today's office visit >>     Ex-smoker> she quit in 2011 &  has remained quit; she denies cough, sput, hemoptysis, CP, SOB, etc;  CXR 6/12 was clear, NAD...    Hyperlipid> on Gemfib600Bid now; last FLP 11/13 showed TChol 207, TG 454, HDL 31, LDL 113 on diet alone & Lopid started; tol well, hasn't lost any wt...    DM> on Metform500-2Bid & Glimep4Bid, can't afford & won't take other meds; labs today showed BS=159, A1c=6.7; same meds but MUST get the weight down...    Obesity> wt is unchanged at 220# & we reviewed diet, exercise, & wt reduction strategies...    GI> HH, Hx Crohn's> she is off prev Omep40 & uses OTC Prilosec20 as needed; followed by [facility]; denies abd pain, dysphagia, n/v, c/d, blood seen...    Elev LFTs> suspect FLD as she denies Etoh etc; no prev imaging studies at her request due to lack of insurance; she understands  the need to lose weight.    Hx Ovarian Cyst> her Gyn was DrSigSmith but she needs a new gyn since he passed away in 09/19/2010... We reviewed prob list, meds, xrays and labs> see below for updates >> she had the 09/19/2011 Flu vaccine 11/13... LABS 3/14:  Chems- ok w BS=159, A1c=6.7   ~  January 14, 2013:  64mo ROV & CPX> Adrienne Goodwin has lost 14# to 205# today, unfortunately DM control & Lipids are way off (see below);  We reviewed the following medical problems during today's office visit >>     Ex-smoker> she quit in 18-Sep-2009 & has remained quit; she denies cough, sput, hemoptysis, CP, SOB, etc;  CXR 9/14 is clear, NAD...    AbnEKG, FamHx CAD>  EKG 9/14 w/ diffuse STTWA; Father died age51 w/ MI; pt has mult risk factors- exsmoker, DM, HL, Obese, FamHx => refer to Cards for further eval...    Hyperlipid> she stopped prev Gemfib rx; FLP 9/14 shows TChol 259, TG 881, HDL 33, LDL 67 on diet alone & asked to restart GEMFIB600Bid...    DM> on Metform500-2Bid & Glimep4Bid; labs today showed BS=339, A1c=12.0;  Needs insulin & we will refer to DrGherghe...    Obesity> wt is down 14# to 205# today; "I watch what I eat" diet; we reviewed diet, exercise, & wt reduction strategies...    GI> HH, Hx Crohn's> she is off prev Omep40 & uses OTC Prilosec20 as needed; followed by [facility]; denies abd pain, dysphagia, n/v, c/d, blood seen...    Elev LFTs> suspect FLD as she denies Etoh etc; no prev imaging studies at her request due to lack of insurance; Labs 9/14 showed GOT=39, GPT=67; she understands the need to lose weight.    Hx Ovarian Cyst> her Gyn was DrSigSmith but she needs a new gyn since he passed away in 2010/09/19; she promises to obtain new Gyn & get a pelvic exam... We reviewed prob list, meds, xrays and labs> see below for updates >>  CXR 9/14 showed norm heart size, clear lungs, WNL, NAD.Marland KitchenMarland Kitchen EKG 9/14 showed STachy, rate105, diffuse NSSTTWA => needs refer to Cards for further eval, she requests DrMcLean. LABS 9/14:  FLP-  not at goals w/ TChol=259 TG=881;  Chems- BS=339 A1c=12.0 & sl elev LFTs c/w FLD;  CBC- wnl;  TSH=2.83;  Umicroalb=neg...           Problem List:    Hx of SINUSITIS (ICD-473.9) - s/p deviated septum surg 1991 by DrLawrence... she has prev allergy testing by DrESL in 19-Sep-1994 w/ +reactions to mold & dust... ~  4/13:  Springtime allergies acting up,  using OTC antihist & given samples of Nasonex for prn use...  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 ~  CXR 6/12 showed clear lungs, NAD... ~  EKG 6/12 showed NSR, minor NSSTTWA... ~  She denies cough, sput, hemoptysis, CP, palpit, SOB, edema; advised exercise program...  HYPERLIPIDEMIA>  Prev on diet alone (low chol, low fat)=> placed on LOPID 600mg Bid, she stopped on her own, asked to restart... ~  FLP 6/12 on diet alone showed TChol 241, TG 289, HDL 37, LDL 166... We decided on diet & exercise to start... ~  4/13 & 7/13: here for afternoon appt & we discussed f/u FLP on ret fasting in AM... ~  FLP 11/13 on diet showed TChol 207, TG 454, HDL 31, LDL 113... Needs better diet, wt reduction, & start GEMFIBROZIL 600mg Bid (she stopped on her own). ~  FLP 9/14 on diet alone showed TChol 259, TG 881, HDL 33, LDL 67... Asked to restart GEMFIB600Bid  DIABETES MELLITUS (ICD-250.00) - on GLIMEPIRIDE 4mg Bid & METFORMIN 500mg - 2Bid => referred to Endocrine 9/14... ~  NEW PT labs 2/12 showed BS= 472, A1c= 9.9.Marland KitchenMarland Kitchen started Metform500Bid & Glimep2mg /d. ~  4/12:  She notes FBS down to 110-125 range, but hasn't lost any wt & we discussed this... ~  Labs 6/12 showed BS= 126, A1c= 6.3, Umicroalb= neg... Great job, now get wt down; she decr the [employer]- 1/2 tab Qam on her own. ~  9/12: Wt down 12# to 214#; states on meds regularly (Metform500Bid & Glim2mg -1/2am), BS=387 & A1c=10.7, Pharm says last refilled 30d supplies 09/19/10!!! ~  1/13: Wt back up 11# to 225#; on Metform500Bid & Glimep2mg /d confirmed by Pharm> BS=284, A1c=8.8;  REC> incr Metform1000Bid & Glimep4mg /d. ~  Labs 4/13 on Metform2Bid+Glimep4 showed FSBS= 215, A1c= 7.9; we reviewed options & cost of additional meds; SHE MUST GET WT DOWN!!! ~  Labs 7/13 on Metform500-2Bid+Glim4 showed BS= 213, A1c= 9.2; Rec to incr Glim4mg Bid, get on diet, get wt down further... ~  Labs 11/13 on Metform2Bid+Glim4Bid showed BS= 146, A1c= 6.4.Marland KitchenMarland Kitchen Continue same. ~  Labs 3/14 on Metform2Bid+Glim4Bid showed BS= 159, A1c= 6.7.Marland KitchenMarland Kitchen Still hasn't lost any wt & we reviewed diet, exercise, wt reduction... ~  Labs 9/14 on Metform500-2Bid+Glim4Bid showed BS= 339, A1c= 12.0.Marland KitchenMarland Kitchen and she has lost 14#; needs insulin, refer to Endocrine, DrGherghe...  OBESITY (ICD-278.00) - we disussed wt reduction & referral to [facility]... ~  review of paper chart shows weights 180-200 in late 90's  ~  weight 2/12 = 219# ==> 2 wk f/u recheck wt=221# & we reviewed the diet recommendations. ~  Weight 4/12 = 221# but she notes "I'm losing inches"... Discussed goal of losing 1 lb every week. ~  Weight 6/12 = 226# & we discussed need for a REAL diet & exercise program. ~  Weight 9/12 = 214# ~  Weight 1/13 = 225#... We reviewed diet, exercise, wt reduction program. ~  Weight 4/13 = 229# ~  Weight 7/13 = 221# ~  Weight 11/13 = 219# ~  Weight 3/14 = 220# ~  Weight 9/14 = 205#  HIATAL HERNIA (ICD-553.3) - she had EGD by DrPatterson 1999 w/ sm HH, otherw neg... ~  AbdSonar 1999 showed 2 sm GB polyps, no stones, no inflamm etc... ~  1/13:  She is c/o reflux symptoms but declines GI referral; we discussed trial OMEPRAZOLE 40mg /d, antireflux regimen etc (this helped).. ~  She stopped the Omep40 on her own & asked to use OTC  Prilosec20 as needed for symptoms...  ?Hx of CROHN'S DISEASE, SMALL INTESTINE (ICD-555.0) - followed by DrPatterson & last colonoscopy was 1999 showing normal terminal ileum w/o signs of inflamm bowel dis... he felt that she prob had IBS and she was given trial Librax... ~  CT Abd/ Pelvis in 1995  was neg x for complex right ovarian cyst (referred to DrSSmith). ~  note: she had a neg colonoscopy 1995 by DrPerry...  BORDERLINE LFTs> likely FATTY LIVER DISEASE> we discussed this condition & the need to lose weight. ~  Labs 6/12 showed SGOT= 40, SGPT= 55 ~  Labs 11/13 showed SGOT= 45, SGPT= 81... Must diet, get wt down!!! ~  Labs 9/14 showed SGOT=39, SGPT=67... We discussed NAFLD & need to lose more wt...  Hx of OVARIAN CYST (ICD-620.2) - prev GYN eval by DrSigSmith ~  9/14:  She is overdue for f/u pelvic exam & promises to get new Gyn...  Hx of DERMATITIS (ICD-692.9) PERSONAL HISTORY OF ALLERGY TO LATEX (ICD-V15.07) - sent to [facility] for eval in 1997.  HEALTH MAINTENANCE >> ~  DM:  Needs DM eye exam & advised to sched at her convenience;  Monofilament test is normal... ~  GI:  Followed by DrPatterson w/ prob IBS, neg colonoscopy 1999, now 36y/o & no GI symptoms... ~  GYN:  It's been >36yrs since her last GYN check & PAP, advised to sched at her convenience... ~  Immuniz:  Advised to get yearly seasonal flu vaccine each fall;  Needs baseline TDAP (wants to wait);  Needs baseline Pneumovax as well (Diabetic).   Past Surgical History  Procedure Laterality Date  . Tonsillectomy and adenoidectomy    . Nasal septum surgery      Outpatient Encounter Prescriptions as of 01/14/2013  Medication Sig Dispense Refill  . diphenhydrAMINE (SOMINEX) 25 MG tablet Take 25 mg by mouth at bedtime as needed.      Marland Kitchen glimepiride (AMARYL) 4 MG tablet TAKE 1 TABLET (4 MG TOTAL) BY MOUTH 2 (TWO) TIMES DAILY.  180 tablet  3  . glucose blood test strip Test daily as needed  100 each  12  . guaiFENesin (MUCINEX) 600 MG 12 hr tablet Take 1,200 mg by mouth 2 (two) times daily as needed.       . metFORMIN (GLUCOPHAGE) 500 MG tablet TAKE 2 TABLETS (1,000 MG TOTAL) BY MOUTH 2 (TWO) TIMES DAILY WITH A MEAL.  360 tablet  3  . [DISCONTINUED] gemfibrozil (LOPID) 600 MG tablet Take 1 tablet (600 mg total) by mouth 2  (two) times daily before a meal.  60 tablet  11   No facility-administered encounter medications on file as of 01/14/2013.    Allergies  Allergen Reactions  . Latex     REACTION: rash    Current Medications, Allergies, Past Medical History, Past Surgical History, Family History, and Social History were reviewed in [employer] record.     Review of Systems         See HPI - all other systems neg except as noted... The patient complains of hoarseness and prolonged cough.  The patient denies anorexia, fever, weight loss, weight gain, vision loss, decreased hearing, chest pain, syncope, dyspnea on exertion, peripheral edema, headaches, hemoptysis, abdominal pain, melena, hematochezia, severe indigestion/heartburn, hematuria, incontinence, muscle weakness, suspicious skin lesions, transient blindness, difficulty walking, depression, unusual weight change, abnormal bleeding, enlarged lymph nodes, and angioedema.     Objective:   Physical Exam     WD, Obese,  37 y/o WF in NAD... she is somewhat anxious... GENERAL:  Alert & oriented; pleasant & cooperative... HEENT:  [place]/AT, EOM-wnl, PERRLA, Fundi-benign, EACs-clear, TMs-wnl, NOSE-clear, THROAT-clear & wnl. NECK:  Supple w/ full ROM; no JVD; normal carotid impulses w/o bruits; no thyromegaly or nodules palpated; no lymphadenopathy. CHEST:  Chest clear to P & A; without wheezes/ rales/ or rhonchi heard... HEART:  Regular Rhythm; without murmurs/ rubs/ or gallops. ABDOMEN:  Obese, soft & nontender; normal bowel sounds; no organomegaly or masses detected. EXT: without deformities or arthritic changes; no varicose veins/ venous insuffic/ or edema. NEURO:  CN's intact; motor testing normal; sensory testing normal; gait normal & balance OK. DERM:  No lesions noted; no rash etc...  RADIOLOGY DATA:  Reviewed in the EPIC EMR & discussed w/ the patient...    LABORATORY DATA:  Reviewed in the EPIC EMR & discussed w/ the  patient...   Assessment & Plan:    DM>  On Glimep4Bid & Meform1000Bid> A1c is way off at 12 now & she needs insulin=> refer to Endocrine, DrGherghe...   Ex-smoker>  She has remained quit & congratulated on this accomplishment!...  CHOL>  Her FLP is abn & all parameters are off; needs the Gemfib600Bid regularly, asked to restart this med, better diet & DM control...  OBESITY>  Everything hinges upon her losing weight & she needs to be serious about diet & exercise; she has no insurance & can't afford dietary counseling or Bariatric approach...  HH>  She notes improved GI symptoms & denies GERD, dysphagia, etc...  Crohn's>  Stable w/o abd pain, N V D or blood seen...  Fatty Liver dis>  LFTs are sl elev & we reviewed the need for wt reduction etc...  Other medical issues as noted... Needs Pneumovax, TDAP, GYN check, etc but she wants to wait- no insurance & asked to look into medicaid application (hasn't done this yet?).Marland KitchenMarland Kitchen

## 2013-01-15 ENCOUNTER — Other Ambulatory Visit: Payer: Self-pay | Admitting: Pulmonary Disease

## 2013-01-15 DIAGNOSIS — E119 Type 2 diabetes mellitus without complications: Secondary | ICD-10-CM

## 2013-01-15 DIAGNOSIS — R9431 Abnormal electrocardiogram [ECG] [EKG]: Secondary | ICD-10-CM

## 2013-01-22 ENCOUNTER — Encounter: Payer: Self-pay | Admitting: Pulmonary Disease

## 2013-01-22 MED ORDER — GLUCOSE BLOOD VI STRP: ORAL_STRIP | Status: DC

## 2013-01-29 ENCOUNTER — Other Ambulatory Visit: Payer: Self-pay | Admitting: Pulmonary Disease

## 2013-01-29 MED ORDER — GEMFIBROZIL 600 MG PO TABS: 600.0000 mg | ORAL_TABLET | Freq: Two times a day (BID) | ORAL | Status: DC

## 2013-01-29 NOTE — Telephone Encounter (Signed)
Pt stated that she needs the rx for the lopid sent to the [facility] on [address].  This has been sent in for the pt.  Nothing further is needed.

## 2013-02-03 ENCOUNTER — Encounter: Payer: Self-pay | Admitting: Internal Medicine

## 2013-02-03 ENCOUNTER — Ambulatory Visit (INDEPENDENT_AMBULATORY_CARE_PROVIDER_SITE_OTHER): Payer: Self-pay | Admitting: Internal Medicine

## 2013-02-03 VITALS — BP 110/64 | HR 98 | Temp 97.9°F | Resp 12 | Ht 65.5 in | Wt 206.7 lb

## 2013-02-03 DIAGNOSIS — E119 Type 2 diabetes mellitus without complications: Secondary | ICD-10-CM

## 2013-02-03 MED ORDER — INSULIN NPH (HUMAN) (ISOPHANE) 100 UNIT/ML ~~LOC~~ SUSP: 12.0000 [IU] | Freq: Two times a day (BID) | SUBCUTANEOUS | Status: DC

## 2013-02-03 MED ORDER — INSULIN PEN NEEDLE 32G X 4 MM MISC: Status: DC

## 2013-02-03 NOTE — Progress Notes (Signed)
Patient ID: Adrienne Goodwin, female   DOB: 1975-12-10, 37 y.o.   MRN: [id number]  HPI: Adrienne Goodwin is a 37 y.o.-year-old female, referred by her PCP, Dr. Kriste Basque, for management of DM2, non-insulin-dependent, uncontrolled, without complications. She is here with her mother who offers part of the history.   Patient has been diagnosed with diabetes in 2012; she has not been on insulin before. Last hemoglobin A1c was: Lab Results  Component Value Date   HGBA1C 12.0* 01/14/2013   HGBA1C 6.7* 07/11/2012   HGBA1C 6.4 03/13/2012  She mentions that in last 6 mo she did not exercise and eating fast food.   Pt is on a regimen of: - Metformin 1000 mg po bid - Amaryl 4 mg po bid Did not try other meds. 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 They do not think they can come to see me since they have to pay 140$ for a visit.   Pt checks her sugars once a week and they are: - 2h after a meal: 200s No lows. Lowest sugar was 50s - a year ago; she has hypoglycemia awareness - ? CBG. Highest sugar was  275.  Pt has 2 meals a day: - Brunch: eating out chop steak + baked potato + salad - Dinner: vegetables - 3 for dinner - Snacks: PB + Crackers Drinks water and sweet tea - replacement for sodas.   - no CKD, last Goodwin/creatinine:  Lab Results  Component Value Date   Goodwin 11 01/14/2013   CREATININE 0.9 01/14/2013  Urine ACR 4.5 01/14/2013, prev. 0.4. She is not on an ACEI. - Has HTG - last set of lipids: Lab Results  Component Value Date   CHOL 259* 01/14/2013   HDL 32.80* 01/14/2013   LDLDIRECT 66.6 01/14/2013   TRIG 881.0 Verified by manual dilution.* 01/14/2013   CHOLHDL 8 01/14/2013  On Gemfibrozil. - last eye exam was in 2013. No DR.  - no numbness and tingling in her feet. Foot exam normal by PCP 01/14/2013.   I reviewed her chart and she also has a history of Crohn's ds., hiatal hernia.   Pt has  FH of DM in GF and uncle.   ROS: Constitutional: no weight gain/loss, no fatigue, + hot flushes Eyes: no blurry vision, no xerophthalmia ENT: no sore throat, no nodules palpated in throat, no dysphagia/odynophagia, no hoarseness Cardiovascular: no CP/SOB/palpitations/leg swelling Respiratory: no cough/SOB Gastrointestinal: no N/V/D/C, + heartburn Musculoskeletal: no muscle/joint aches Skin: no rashes Neurological: no tremors/numbness/tingling/dizziness, + HA Psychiatric: no depression/anxiety  Past Medical History  Diagnosis Date  . Unspecified sinusitis (chronic)   . Tobacco use disorder   . Type II or unspecified type diabetes mellitus without mention of complication, not stated as uncontrolled   . Obesity, unspecified   . Diaphragmatic hernia without mention of obstruction or gangrene   . Regional enteritis of small intestine   . Other and unspecified ovarian cyst   . Contact dermatitis and other eczema, due to unspecified cause   . Allergy to latex    Past Surgical History  Procedure Laterality Date  . Tonsillectomy and adenoidectomy    . Nasal septum surgery     History   Social History  . 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  Smoking status: Former Smoker    Quit date: 04/24/2008  . Smokeless tobacco: Never Used  . Alcohol Use: No  . Drug Use: No  . Sexual Activity: No   Social History Narrative   Regular exercise: no   Caffeine use: sweet tea, occasionally   Current Outpatient Prescriptions on File Prior to Visit  Medication Sig Dispense Refill  . diphenhydrAMINE (SOMINEX) 25 MG tablet Take 25 mg by mouth at bedtime as needed.      Marland Kitchen gemfibrozil (LOPID) 600 MG tablet Take 1 tablet (600 mg total) by mouth 2 (two) times daily before a meal.  60 tablet  6  . glimepiride (AMARYL) 4 MG tablet TAKE 1 TABLET (4 MG TOTAL) BY MOUTH 2 (TWO) TIMES DAILY.  180  tablet  3  . glucose blood test strip Test daily as needed  100 each  12  . guaiFENesin (MUCINEX) 600 MG 12 hr tablet Take 1,200 mg by mouth 2 (two) times daily as needed.       . metFORMIN (GLUCOPHAGE) 500 MG tablet TAKE 2 TABLETS (1,000 MG TOTAL) BY MOUTH 2 (TWO) TIMES DAILY WITH A MEAL.  360 tablet  3   No current facility-administered medications on file prior to visit.   Allergies  Allergen Reactions  . Latex     REACTION: rash   Family History  Problem Relation Age of Onset  . Emphysema Mother   . Coronary artery disease Father    PE: BP 110/64  Pulse 98  Temp(Src) 97.9 F (36.6 C) (Oral)  Resp 12  Ht 5' 5.5" (1.664 m)  Wt 206 lb 11.2 oz (93.759 kg)  BMI 33.86 kg/m2  SpO2 98% Wt Readings from Last 3 Encounters:  02/03/13 206 lb 11.2 oz (93.759 kg)  01/14/13 205 lb 3.2 oz (93.078 kg)  07/11/12 219 lb 9.6 oz (99.61 kg)   Constitutional: overweight, in NAD Eyes: PERRLA, EOMI, no exophthalmos ENT: tonsillar enlargement, moist mucous membranes, no thyromegaly, no cervical lymphadenopathy Cardiovascular: RRR, No MRG Respiratory: CTA B Gastrointestinal: abdomen soft, NT, ND, BS+ Musculoskeletal: no deformities, strength intact in all 4 Skin: moist, warm, rash on face (? Eczema) Neurological: no tremor with outstretched hands, DTR normal in all 4  ASSESSMENT: 1. DM2, insulin-dependent, uncontrolled, without complications  PLAN:  1. Patient with a 2-year h/o DM2, recently very uncontrolled, likely due to change in dietary patterns (fast food) and lack of exercise, on oral antidiabetic regimen, which became insufficient. - she and her mother are concerned that they could not return to see me as they have to pay $140 for an appointment. We will need to see whether we can set him up for financial assistance. - We discussed about options for treatment, which are not many do to the fact that she does not have insurance, and I suggested to: add NPH 12 units in am and 12 units  in HS - Insulin 70/30 would have been another option but I would prefer not to add regular insulin without seeing more CBG data points - we discussed at length about improving her diet and I gave her specific suggestions for healthy eating (see patient instructions). - this diet will greatly help her HTG, too. Also, insulin will help with HTG as it stimulates the lipoprotein lipase. - Strongly advised her to start checking her sugars at different times of the day - check 2 times a day, rotating checks - given sugar log and advised how to fill it and to bring it at  next appt  - given foot care handout and explained the principles  - given instructions for hypoglycemia management "15-15 rule"  - pt hd flu vaccine at last visit with PCP 01/14/13 - Return to clinic in one month with sugar log

## 2013-02-03 NOTE — Patient Instructions (Signed)
Inject 12 units of NPH insulin before breakfast and at bedtime. When injecting insulin:  Inject in the abdomen  Rotate the injection sites around the belly button  Change needle for each injection  Keep needle in for 10 sec after last unit of insulin in  Can keep the insulin in use out of the fridge but keep the rest in the fridge Continue Metformin and Amaryl at the current doses. Please return in 1 month with your sugar log.   PATIENT INSTRUCTIONS FOR TYPE 2 DIABETES:  DIET AND EXERCISE Diet and exercise is an important part of diabetic treatment.  We recommended aerobic exercise in the form of brisk walking (working between 40-60% of maximal aerobic capacity, similar to brisk walking) for 150 minutes per week (such as 30 minutes five days per week) along with 3 times per week performing 'resistance' training (using various gauge rubber tubes with handles) 5-10 exercises involving the major muscle groups (upper body, lower body and core) performing 10-15 repetitions (or near fatigue) each exercise. Start at half the above goal but build slowly to reach the above goals. If limited by weight, joint pain, or disability, we recommend daily walking in a swimming pool with water up to waist to reduce pressure from joints while allow for adequate exercise.    BLOOD GLUCOSES Monitoring your blood glucoses is important for continued management of your diabetes. Please check your blood glucoses 2-4 times a day: fasting, before meals and at bedtime (you can rotate these measurements - e.g. one day check before the 3 meals, the next day check before 2 of the meals and before bedtime, etc.   HYPOGLYCEMIA (low blood sugar) Hypoglycemia is usually a reaction to not eating, exercising, or taking too much insulin/ other diabetes drugs.  Symptoms include tremors, sweating, hunger, confusion, headache, etc. Treat IMMEDIATELY with 15 grams of Carbs:   4 glucose tablets    cup regular juice/soda   2  tablespoons raisins   4 teaspoons sugar   1 tablespoon honey Recheck blood glucose in 15 mins and repeat above if still symptomatic/blood glucose <100. Please contact our office at [phone] if you have questions about how to next handle your insulin.  RECOMMENDATIONS TO REDUCE YOUR RISK OF DIABETIC COMPLICATIONS: * Take your prescribed MEDICATION(S). * Follow a DIABETIC diet: Complex carbs, fiber rich foods, heart healthy fish twice weekly, (monounsaturated and polyunsaturated) fats * AVOID saturated/trans fats, high fat foods, >2,300 mg salt per day. * EXERCISE at least 5 times a week for 30 minutes or preferably daily.  * DO NOT SMOKE OR DRINK more than 1 drink a day. * Check your FEET every day. Do not wear tightfitting shoes. Contact us if you develop an ulcer * See your EYE doctor once a year or more if needed * Get a FLU shot once a year * Get a PNEUMONIA vaccine once before and once after age 7 years  GOALS:  * Your Hemoglobin A1c of <7%  * fasting sugars need to be <130 * after meals sugars need to be <180 (2h after you start eating) * Your Systolic BP should be 140 or lower  * Your Diastolic BP should be 80 or lower  * Your HDL (Good Cholesterol) should be 40 or higher  * Your LDL (Bad Cholesterol) should be 100 or lower  * Your Triglycerides should be 150 or lower  * Your Urine microalbumin (kidney function) should be <30 * Your Body Mass Index should be 25 or lower  We will be glad to help you achieve these goals. Our telephone number is: [phone].  Please consider the following ways to cut down carbs and fat and increase fiber and micronutrients in your diet:  - substitute whole grain for white bread or pasta - substitute brown rice for white rice - substitute 90-calorie flat bread pieces for slices of bread when possible - substitute sweet potatoes or yams for white potatoes - substitute humus for margarine - substitute tofu for cheese when possible -  substitute almond or rice milk for regular milk (would not drink soy milk daily due to concern for soy estrogen influence on breast cancer risk) - substitute dark chocolate for other sweets when possible - substitute water - can add lemon or orange slices for taste - for diet sodas (artificial sweeteners will trick your body that you can eat sweets without getting calories and will lead you to overeating and weight gain in the long run) - do not skip breakfast or other meals (this will slow down the metabolism and will result in more weight gain over time)  - can try smoothies made from fruit and almond/rice milk in am instead of regular breakfast - can also try old-fashioned (not instant) oatmeal made with almond/rice milk in am - order the dressing on the side when eating salad at a restaurant (pour less than half of the dressing on the salad) - eat as little meat as possible - can try juicing, but should not forget that juicing will get rid of the fiber, so would alternate with eating raw veg./fruits or drinking smoothies - use as little oil as possible, even when using olive oil - can dress a salad with a mix of balsamic vinegar and lemon juice, for e.g. - use agave nectar, stevia sugar, or regular sugar rather than artificial sweateners - steam or broil/roast veggies  - snack on veggies/fruit/nuts (unsalted, preferably) when possible, rather than processed foods - reduce or eliminate aspartame in diet (it is in diet sodas, chewing gum, etc) Read the labels!  Try to read Dr. Katherina Right book: "Program for Reversing Diabetes" for the vegan concept and other ideas for healthy eating.

## 2013-02-05 ENCOUNTER — Telehealth: Payer: Self-pay | Admitting: *Deleted

## 2013-02-05 NOTE — Telephone Encounter (Signed)
Pt called and said the pens were too expensive and her mom couldn't afford that, she wanted it switched to the vial instead, I called the pharmacy and gave them a verbal authorization.

## 2013-02-05 NOTE — Telephone Encounter (Signed)
Thank you so much, Adrienne Goodwin!

## 2013-02-06 ENCOUNTER — Telehealth: Payer: Self-pay | Admitting: *Deleted

## 2013-02-06 ENCOUNTER — Other Ambulatory Visit: Payer: Self-pay | Admitting: *Deleted

## 2013-02-06 MED ORDER — "INSULIN SYRINGE 29G X 1/2"" 1 ML MISC": Status: DC

## 2013-02-06 MED ORDER — INSULIN NPH (HUMAN) (ISOPHANE) 100 UNIT/ML ~~LOC~~ SUSP
SUBCUTANEOUS | Status: DC
Start: 2013-02-06 — End: 2013-02-11

## 2013-02-06 NOTE — Telephone Encounter (Signed)
Opened encounter in error  

## 2013-02-06 NOTE — Telephone Encounter (Signed)
Pt called and requested to change her insulin from a pen to a vial, so her insurance will pay for it.

## 2013-02-11 ENCOUNTER — Ambulatory Visit (INDEPENDENT_AMBULATORY_CARE_PROVIDER_SITE_OTHER): Payer: Self-pay | Admitting: Cardiology

## 2013-02-11 ENCOUNTER — Encounter: Payer: Self-pay | Admitting: Cardiology

## 2013-02-11 VITALS — BP 116/76 | HR 93 | Ht 65.5 in | Wt 203.0 lb

## 2013-02-11 DIAGNOSIS — R0989 Other specified symptoms and signs involving the circulatory and respiratory systems: Secondary | ICD-10-CM

## 2013-02-11 DIAGNOSIS — R0609 Other forms of dyspnea: Secondary | ICD-10-CM

## 2013-02-11 DIAGNOSIS — R9431 Abnormal electrocardiogram [ECG] [EKG]: Secondary | ICD-10-CM

## 2013-02-11 DIAGNOSIS — E785 Hyperlipidemia, unspecified: Secondary | ICD-10-CM

## 2013-02-11 NOTE — Progress Notes (Signed)
Patient ID: Adrienne Goodwin, female   DOB: [birth year], 37 y.o.   MRN: [id number] PCP: Dr. Kriste Basque  37 yo with history of type II diabetes and hyperlipidemia presents for cardiology evaluation.  She was noted recently to have an abnormal ECG with inferior and anterolateral T wave flattening.  She has had diabetes diagnosed for about 2 years now.  She has been seeing endocrinology.  Hemoglobin A1c is still much too high.  She has also had very high triglycerides and recently restarted on gemfibrozil.  Her father died of an MI at age 24.  She does not get chest pain or tightness.  She does get short of breath after walking about a mile.  She can walk up a flight of steps without trouble.  No tachypalpitations or lightheadedness.  She quit smoking in 2011.    ECG: NSR, nonspecific inferior and anterolateral T wave flattening.    Labs (9/14): K 4, creatinine 0.9, hemoglobin A1c 12, TSH normal, AST 39, ALT 67, TGs 881, LDL 67, HDL 33  PMH: 1. Type II diabetes 2. Hyperlipidemia 3. Obesity 4. ?Crohns disease (uncertain dx).  5. Hiatal hernia 6. Non-alcoholic steatohepatitis  SH: Quit smoking in 2011, lives in [place].  FH: Father with MI at 21, mother with PCM and COPD, grandfather and grandmother with CABG.   ROS: All systems reviewed and negative except as per HPI.   Current Outpatient Prescriptions  Medication Sig Dispense Refill  . diphenhydrAMINE (SOMINEX) 25 MG tablet Take 25 mg by mouth at bedtime as needed.      Marland Kitchen gemfibrozil (LOPID) 600 MG tablet Take 1 tablet (600 mg total) by mouth 2 (two) times daily before a meal.  60 tablet  6  . glimepiride (AMARYL) 4 MG tablet TAKE 1 TABLET (4 MG TOTAL) BY MOUTH 2 (TWO) TIMES DAILY.  180 tablet  3  . glucose blood test strip Test daily as needed  100 each  12  . guaiFENesin (MUCINEX) 600 MG 12 hr tablet Take 1,200 mg by mouth 2 (two) times daily as needed.       . insulin NPH (HUMULIN N) 100 UNIT/ML injection Inject 12 Units into the skin 2 (two)  times daily at 8 am and 10 pm.  9 mL  3  . Insulin Pen Needle (CLICKFINE PEN NEEDLES) 32G X 4 MM MISC Use 2x a day  100 each  3  . metFORMIN (GLUCOPHAGE) 500 MG tablet TAKE 2 TABLETS (1,000 MG TOTAL) BY MOUTH 2 (TWO) TIMES DAILY WITH A MEAL.  360 tablet  3  . aspirin EC 81 MG tablet Take 1 tablet (81 mg total) by mouth daily.       No current facility-administered medications for this visit.    BP 116/76  Pulse 93  Ht 5' 5.5" (1.664 m)  Wt 92.08 kg (203 lb)  BMI 33.26 kg/m2 General: NAD, overweight Neck: No JVD, no thyromegaly or thyroid nodule.  Lungs: Clear to auscultation bilaterally with normal respiratory effort. CV: Nondisplaced PMI.  Heart regular S1/S2, no S3/S4, no murmur.  No peripheral edema.  No carotid bruit.  Normal pedal pulses.  Abdomen: Soft, nontender, no hepatosplenomegaly, no distention.  Skin: Intact without lesions or rashes.  Neurologic: Alert and oriented x 3.  Psych: Normal affect. Extremities: No clubbing or cyanosis.  HEENT: Normal.   Assessment/Plan: 1. Abnormal ECG: Patient does have a nonspecifically abnormal ECG with inferior and anterolateral T wave flattening.  This was not seen on prior ECG.  She does  not have exertional chest pain but does have mild exertional dyspnea.  Main cardiac risk factors are diabetes, family history of premature CAD, and hyperlipidemia.  She is a former smoker. Given her diabetes, it would not be unreasonable for her to take ASA 81 mg daily.  She does not have a history of abnormal bleeding.  I will have her get an echocardiogram to make sure that her heart is structurally normal.   2. Hyperlipidemia: Very high triglycerides in 9/14.  He was restarted on gemfibrozil after that.  Due for lipids with Dr. Kriste Basque in 12/14.   Marca Ancona 02/11/2013 5:50 PM

## 2013-02-11 NOTE — Patient Instructions (Addendum)
Your physician has requested that you have an echocardiogram. Echocardiography is a painless test that uses sound waves to create images of your heart. It provides your doctor with information about the size and shape of your heart and how well your heart's chambers and valves are working. This procedure takes approximately one hour. There are no restrictions for this procedure.  Start aspirin 81mg  daily.  Your physician wants you to follow-up in: 1 year with Dr Shirlee Latch. (October 2015).  You will receive a reminder letter in the mail two months in advance. If you don't receive a letter, please call our office to schedule the follow-up appointment.

## 2013-02-24 ENCOUNTER — Telehealth: Payer: Self-pay | Admitting: Pulmonary Disease

## 2013-02-24 NOTE — Telephone Encounter (Signed)
I spoke with pt. She reports Dr. Lafe Garin changed her to start testing her BS BID. I advised pt since SN is not the one who changed this then she needed to call her office for RX to reflect change. She will do so. Nothing further needed

## 2013-02-26 ENCOUNTER — Ambulatory Visit (HOSPITAL_COMMUNITY): Payer: Self-pay | Attending: Pulmonary Disease | Admitting: Cardiology

## 2013-02-26 DIAGNOSIS — E119 Type 2 diabetes mellitus without complications: Secondary | ICD-10-CM | POA: Insufficient documentation

## 2013-02-26 DIAGNOSIS — R0989 Other specified symptoms and signs involving the circulatory and respiratory systems: Secondary | ICD-10-CM | POA: Insufficient documentation

## 2013-02-26 DIAGNOSIS — E785 Hyperlipidemia, unspecified: Secondary | ICD-10-CM | POA: Insufficient documentation

## 2013-02-26 DIAGNOSIS — R9431 Abnormal electrocardiogram [ECG] [EKG]: Secondary | ICD-10-CM | POA: Insufficient documentation

## 2013-02-26 DIAGNOSIS — R0609 Other forms of dyspnea: Secondary | ICD-10-CM | POA: Insufficient documentation

## 2013-02-26 DIAGNOSIS — Z87891 Personal history of nicotine dependence: Secondary | ICD-10-CM | POA: Insufficient documentation

## 2013-02-26 NOTE — Progress Notes (Signed)
Echo performed. 

## 2013-03-06 ENCOUNTER — Encounter: Payer: Self-pay | Admitting: Internal Medicine

## 2013-03-06 ENCOUNTER — Ambulatory Visit (INDEPENDENT_AMBULATORY_CARE_PROVIDER_SITE_OTHER): Payer: Self-pay | Admitting: Internal Medicine

## 2013-03-06 VITALS — BP 130/90 | HR 96 | Temp 97.7°F | Resp 20 | Ht 65.0 in | Wt 207.5 lb

## 2013-03-06 DIAGNOSIS — E119 Type 2 diabetes mellitus without complications: Secondary | ICD-10-CM

## 2013-03-06 MED ORDER — INSULIN NPH (HUMAN) (ISOPHANE) 100 UNIT/ML ~~LOC~~ SUSP: 12.0000 [IU] | Freq: Two times a day (BID) | SUBCUTANEOUS | Status: DC

## 2013-03-06 MED ORDER — GLIMEPIRIDE 4 MG PO TABS: ORAL_TABLET | ORAL | Status: DC

## 2013-03-06 NOTE — Progress Notes (Signed)
Patient ID: Adrienne Goodwin, female   DOB: [birth year], 37 y.o.   MRN: [id number]  HPI: Adrienne Goodwin is a 37 y.o.-year-old female, returning for f/u for DM2, dx 2012, insulin-dependent since 01/2013, uncontrolled, without complications.   Last hemoglobin A1c was: Lab Results  Component Value Date   HGBA1C 12.0* 01/14/2013   HGBA1C 6.7* 07/11/2012   HGBA1C 6.4 03/13/2012  She mentions that in the 6 mo before the last Hba1c check she did not exercise and eating fast food.   Pt is on a regimen of: - Metformin 1000 mg po bid - Amaryl 4 mg po bid - NPH 12 units bid Did not try other meds. 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 They do not think they can come to see me since they have to pay 140$ for a visit.   Pt checks her sugars 2x a day and they are (per her CBG log): - am: 109, 134 - Before brunch: 125-147 (one at 181) - 2h after brunch: 114-153 - before dinner: 54, 56, 77, 89, 98, 164 - 2h after dinner: 120, 170 - bedtime: 107 Before last visit, 2h after a meal CBG: 200s, she was checking sugars only once a week.  Lowest sugar was 50s x2 before dinner; she has hypoglycemia awareness - 90. Highest sugar was  <200.  Pt has 2 meals a day: - Brunch (12-1 pm): eating out chop steak + baked potato + salad - Dinner (6-6:30): vegetables - 3 for dinner - Snacks: PB + Crackers Drinks water and sweet tea - replacement for sodas.   - no CKD, last BUN/creatinine:  Lab Results  Component Value Date   BUN 11 01/14/2013   CREATININE 0.9 01/14/2013  Urine ACR 4.5 01/14/2013, prev. 0.4. She is not on an ACEI. - Has HTG - last set of lipids: Lab Results  Component Value Date   CHOL 259* 01/14/2013   HDL 32.80* 01/14/2013   LDLDIRECT 66.6 01/14/2013   TRIG 881.0 Verified by manual dilution.* 01/14/2013   CHOLHDL 8 01/14/2013  On Gemfibrozil. - last eye exam was in 2013. No DR.  - no numbness  and tingling in her feet. Foot exam normal by PCP 01/14/2013.   I reviewed her chart and she also has a history of Crohn's ds., hiatal hernia. Had a recent 2D ECHO - nl EF, mild MR.  ROS: Constitutional: no weight gain/loss, no fatigue Eyes: no blurry vision, no xerophthalmia ENT: no sore throat, no nodules palpated in throat, no dysphagia/odynophagia, no hoarseness Cardiovascular: no CP/SOB/palpitations/leg swelling Respiratory: no cough/SOB Gastrointestinal: no N/V/D/C, + heartburn Musculoskeletal: no muscle/joint aches Skin: no rashes Neurological: no tremors/numbness/tingling/dizziness  I reviewed pt's medications, allergies, PMH, social hx, family hx and no changes required, except as mentioned above.  PE: BP 130/90  Pulse 96  Temp(Src) 97.7 F (36.5 C) (Oral)  Resp 20  Ht 5\' 5"  (1.651 m)  Wt 207 lb 8 oz (94.121 kg)  BMI 34.53 kg/m2 Wt Readings from Last 3 Encounters:  03/06/13 207 lb 8 oz (94.121 kg)  02/11/13 203 lb (92.08 kg)  02/03/13 206 lb 11.2 oz (93.759 kg)   Constitutional: overweight, in NAD Eyes: PERRLA, EOMI, no exophthalmos ENT: tonsillar enlargement, moist mucous membranes, no thyromegaly, no cervical lymphadenopathy Cardiovascular: RRR, No MRG Respiratory: CTA B Gastrointestinal: abdomen soft, NT, ND, BS+ Musculoskeletal: no deformities, strength intact in all 4 Skin: moist, warm, rash  on face (? Eczema)  ASSESSMENT: 1. DM2, insulin-dependent, uncontrolled, without complications  PLAN:  1. Patient with a 2-year h/o DM2, recently very uncontrolled, likely due to change in dietary patterns (fast food) and lack of exercise. At last visit we added NPH insulin to her regimen and sugars are now much improved.  She has some lows in the 50s-90s before dinner, though. We discussed at length at last visit about improving her diet and I gave her specific suggestions for healthy eating. She started to implement changes in her diet >> includes now more  vegetables. - for now I suggested to: Continue NPH 12 units in am and 12 units in HS Continue Metformin 1000 mg bid Continue Amaryl but decrease to 2 mg in am and 4 mg at dinner - continue checking her sugars at different times of the day - check 2 times a day, rotating checks - given more sugar logs  - pt hd flu vaccine at last visit with PCP 01/14/13 - refilled NPH - pens - Return to clinic in 3 mo with sugar log

## 2013-03-06 NOTE — Patient Instructions (Addendum)
Continue: - Metformin 1000 mg 2x a day  - NPH 12 units 2x a day Decrease  - Amaryl to 2 mg before brunch and 4 mg before dinner  Please send me a message if sugars <80 or >180 consistently.  Please come back for a follow-up appointment in 3 months - with your sugar log.

## 2013-03-25 ENCOUNTER — Telehealth: Payer: Self-pay | Admitting: Pulmonary Disease

## 2013-03-25 NOTE — Telephone Encounter (Signed)
Will forward message to Leigh to look at [facility] schedule and see if this can be done.

## 2013-03-26 ENCOUNTER — Ambulatory Visit: Payer: Self-pay | Admitting: Pulmonary Disease

## 2013-03-26 NOTE — Telephone Encounter (Signed)
Pt has been scheduled for 04/10/13 at 9am. Nothing further was needed.

## 2013-03-26 NOTE — Telephone Encounter (Signed)
Ok to add pt to the scheduled on 12-18 for an am appt only.  thanks

## 2013-04-10 ENCOUNTER — Ambulatory Visit: Payer: Self-pay | Admitting: Pulmonary Disease

## 2013-04-22 ENCOUNTER — Ambulatory Visit (INDEPENDENT_AMBULATORY_CARE_PROVIDER_SITE_OTHER): Payer: Self-pay | Admitting: Pulmonary Disease

## 2013-04-22 ENCOUNTER — Other Ambulatory Visit (INDEPENDENT_AMBULATORY_CARE_PROVIDER_SITE_OTHER): Payer: Self-pay

## 2013-04-22 ENCOUNTER — Encounter: Payer: Self-pay | Admitting: Pulmonary Disease

## 2013-04-22 VITALS — BP 112/84 | HR 95 | Temp 98.6°F | Ht 65.0 in | Wt 215.6 lb

## 2013-04-22 DIAGNOSIS — Z23 Encounter for immunization: Secondary | ICD-10-CM

## 2013-04-22 DIAGNOSIS — E669 Obesity, unspecified: Secondary | ICD-10-CM

## 2013-04-22 DIAGNOSIS — E785 Hyperlipidemia, unspecified: Secondary | ICD-10-CM

## 2013-04-22 DIAGNOSIS — K5000 Crohn's disease of small intestine without complications: Secondary | ICD-10-CM

## 2013-04-22 DIAGNOSIS — K449 Diaphragmatic hernia without obstruction or gangrene: Secondary | ICD-10-CM

## 2013-04-22 DIAGNOSIS — L259 Unspecified contact dermatitis, unspecified cause: Secondary | ICD-10-CM

## 2013-04-22 LAB — BASIC METABOLIC PANEL
BUN: 13 mg/dL (ref 6–23)
Calcium: 9.6 mg/dL (ref 8.4–10.5)
Chloride: 101 mEq/L (ref 96–112)
GFR: 89.67 mL/min (ref >60.00)
Glucose, Bld: 146 mg/dL — ABNORMAL HIGH (ref 70–99)
Potassium: 4.1 mEq/L (ref 3.5–5.1)
Sodium: 138 mEq/L (ref 135–145)

## 2013-04-22 LAB — LDL CHOLESTEROL, DIRECT: Direct LDL: 153.1 mg/dL

## 2013-04-22 LAB — LIPID PANEL
Cholesterol: 238 mg/dL — ABNORMAL HIGH (ref 0–200)
HDL: 39.2 mg/dL (ref >39.00)
Total CHOL/HDL Ratio: 6
Triglycerides: 357 mg/dL — ABNORMAL HIGH (ref 0.0–149.0)

## 2013-04-22 NOTE — Patient Instructions (Signed)
Today we updated your med list in our EPIC system...    Continue your current medications the same...  Today we rechecked your Lipids and DM labs...    We will contact you w/ the results when available...     We will send copies to DrGherghe as well...  Let's get on track w/ our diet & exercise program...    The goal is to lose 15-20 lbs to start!  We gave you the combination TETANUS shot today called the TDAP- it should be good for 10 yrs...  Call for any questions...  Let's plan a follow up visit in 34mo, sooner if needed for problems.Marland KitchenMarland Kitchen

## 2013-04-29 ENCOUNTER — Encounter: Payer: Self-pay | Admitting: Pulmonary Disease

## 2013-04-29 NOTE — Progress Notes (Signed)
Subjective:    Patient ID: Adrienne Goodwin, female    DOB: 1976-04-14, 38 y.o.   MRN: [id number]  HPI 38 y/o WF, daughter of Janelie Goltz, followed for general medical purposes>>  ~  November 07, 2011:  40mo ROV & Adrienne Goodwin has lost 8# in the interim, taking her Metform&Glimep regularly, and she notes that recent post prandial BS have been 130-160 range... She has been walking some, but there id room for improvement she admits; no new complaints or concerns;  Unfortunately her f/u labs today showed BS= 213 and A1c= 9.2;  She cannot afford, and will not purchase Gliptins or Tzd meds;  Neither can she afford long acting insulin Rx like Lantus or Levemir;  Next step will be either NPH or 70/30 mixed insulin, but for now I'm asking her to improve diet, exercise, & double the glimepiride to $RemoveBefore'4mg'TdOeaCpoBtjRw$ Bid...    We reviewed prob list, meds, xrays and labs> see below>>   ~  March 13, 2012:  40mo ROV & Adrienne Goodwin has only lost 2# down to 219# today; states she watches what she eats (no sweets, no soft drinks) & has been to the dietician; she is walking some at home but not in a regular exercise program 7 we discussed this AGAIN about DIET & EXERCISE...  She notes that he sugars have been improved at home & today measures BS=146 & A1c=6.4 (much improved, great work)...    We reviewed prob list, meds, xrays and labs> see below for updates >>  LABS 11/13:  FLP- not at goals on diet alone & TG=454;  Chems- ok x BS=146, A1c=6.4, SGOT=45, SGPT=81;  CBC- wnl;  TSH=1.47  ~  July 11, 2012:  40mo ROV & Adrienne Goodwin is doing satis- feels well, no new complaints or concerns; she weighs 220# which is unchanged & mother says she is not trying very hard- we had a frank conversation about diet/ exercise/ wt reduction; she states her BS are all good at home...  We reviewed the following medical problems during today's office visit >>     Ex-smoker> she quit in 29-Jul-2009 & has remained quit; she denies cough, sput, hemoptysis, CP, SOB, etc;  CXR 6/12  was clear, NAD...    Hyperlipid> on Gemfib600Bid now; last FLP 11/13 showed TChol 207, TG 454, HDL 31, LDL 113 on diet alone & Lopid started; tol well, hasn't lost any wt...    DM> on Metform500-2Bid & Glimep4Bid, can't afford & won't take other meds; labs today showed BS=159, A1c=6.7; same meds but MUST get the weight down...    Obesity> wt is unchanged at 220# & we reviewed diet, exercise, & wt reduction strategies...    GI> HH, Hx Crohn's> she is off prev Omep40 & uses OTC Prilosec20 as needed; followed by [facility]; denies abd pain, dysphagia, n/v, c/d, blood seen...    Elev LFTs> suspect FLD as she denies Etoh etc; no prev imaging studies at her request due to lack of insurance; she understands the need to lose weight.    Hx Ovarian Cyst> her Gyn was DrSigSmith but she needs a new gyn since he passed away in 2010/07/30... We reviewed prob list, meds, xrays and labs> see below for updates >> she had the 2011-07-30 Flu vaccine 11/13... LABS 3/14:  Chems- ok w BS=159, A1c=6.7   ~  January 14, 2013:  40mo ROV & CPX> Adrienne Goodwin has lost 14# to 205# today, unfortunately DM control & Lipids are way off (see below);  We reviewed the following  medical problems during today's office visit >>     Ex-smoker> she quit in 07-19-2009 & has remained quit; she denies cough, sput, hemoptysis, CP, SOB, etc;  CXR 9/14 is clear, NAD...    AbnEKG, FamHx CAD>  EKG 9/14 w/ diffuse STTWA; Father died age31 w/ MI; pt has mult risk factors- exsmoker, DM, HL, Obese, FamHx => refer to Cards for further eval...    Hyperlipid> she stopped prev Gemfib rx; [place] 9/14 shows TChol 259, TG 881, HDL 33, LDL 67 on diet alone & asked to restart GEMFIB600Bid...    DM> on Metform500-2Bid & Glimep4Bid; labs today showed BS=339, A1c=12.0;  Needs insulin & we will refer to DrGherghe...    Obesity> wt is down 14# to 205# today; "I watch what I eat" diet; we reviewed diet, exercise, & wt reduction strategies...    GI> HH, Hx Crohn's> she is off prev Omep40 &  uses OTC Prilosec20 as needed; followed by [facility]; denies abd pain, dysphagia, n/v, c/d, blood seen...    Elev LFTs> suspect FLD as she denies Etoh etc; no prev imaging studies at her request due to lack of insurance; Labs 9/14 showed GOT=39, GPT=67; she understands the need to lose weight.    Hx Ovarian Cyst> her Gyn was DrSigSmith but she needs a new gyn since he passed away in 07-20-2010; she promises to obtain new Gyn & get a pelvic exam... We reviewed prob list, meds, xrays and labs> see below for updates >>  CXR 9/14 showed norm heart size, clear lungs, WNL, NAD.Marland KitchenMarland Kitchen EKG 9/14 showed STachy, rate105, diffuse NSSTTWA => needs refer to Cards for further eval, she requests DrMcLean. LABS 9/14:  FLP- not at goals w/ TChol=259 TG=881;  Chems- BS=339 A1c=12.0 & sl elev LFTs c/w FLD;  CBC- wnl;  TSH=2.83;  Umicroalb=neg...  ~  April 22, 2013:  40mo ROV and Adrienne Goodwin has gained another 11# up to 216# today; she blames the stress of her mother's illness & her care needs, we discussed this & the importance of taking care of herself first...    She had Cardiac eval from DrMcLean10/14> DM, HL, risk factors w/ +FamHx; quit smoking 07-19-09; no CP, obese, not exercising; EKG w/ NSSTTWA but he did not feel theat she needed stress testing, Rec ASA81, & 2DEcho showed mild LVH, norm LVF w/ EF=60-65%, norm wall motion, mild MR...     On Lopid 600mg Bid plus low fat diet; FLP today showed TChol 238, TG 357, HDL 39, LDL 153; this is improved but has a long way to go and may need statin added but she wants to wait...    She had f/u appt w/ DrGherghe for DM/Endocrine> on Metform1000Bid, Glimep4mg Bid, NPH 12uBid; Labs today are much improved w/ BS= 146, A1c= 7.4 and much improved, we reviewed diet, exercise, wt reduction strategies; she developed some "shakes" in afternoons and decreased the Glimep4mg  in AM to 1/2 tab AM & continue 4mg  Qdinner w/ improvement... We reviewed prob list, meds, xrays and labs> see below for updates  >>  LABS 12/14:  FLP- not at goals but sl improved on the LopidBid;  Chems- ok w/ BS=146, A1c=7.4, Cr=0.8.Marland KitchenMarland Kitchen            Problem List:    Hx of SINUSITIS (ICD-473.9) - s/p deviated septum surg 1991 by DrLawrence... she has prev allergy testing by DrESL in 07/20/1994 w/ +reactions to mold & dust... ~  4/13:  Springtime allergies acting up, using OTC antihist & given samples of Nasonex  for prn use...  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 ~  CXR 6/12 showed clear lungs, NAD... ~  EKG 6/12 showed NSR, minor NSSTTWA... ~  She denies cough, sput, hemoptysis, CP, palpit, SOB, edema; advised exercise program...  HYPERLIPIDEMIA>  Prev on diet alone (low chol, low fat)=> placed on LOPID 600mg Bid, she stopped on her own, asked to restart... ~  FLP 6/12 on diet alone showed TChol 241, TG 289, HDL 37, LDL 166... We decided on diet & exercise to start... ~  4/13 & 7/13: here for afternoon appt & we discussed f/u FLP on ret fasting in AM... ~  FLP 11/13 on diet showed TChol 207, TG 454, HDL 31, LDL 113... Needs better diet, wt reduction, & start GEMFIBROZIL 600mg Bid (she stopped on her own). ~  [place] 9/14 on diet alone showed TChol 259, TG 881, HDL 33, LDL 67... Asked to restart GEMFIB600Bid ~  [place] 12/14 on Gemfib600Bid showed TChol 238, TG 357, HDL 39, LDL 153;  DIABETES MELLITUS (ICD-250.00) - on GLIMEPIRIDE 4mg Bid & METFORMIN 500mg - 2Bid => referred to Endocrine 9/14... ~  NEW PT labs 2/12 showed BS= 472, A1c= 9.9.Marland KitchenMarland Kitchen started Metform500Bid & Glimep2mg /d. ~  4/12:  She notes FBS down to 110-125 range, but hasn't lost any wt & we discussed this... ~  Labs 6/12 showed BS= 126, A1c= 6.3, Umicroalb= neg... Great job, now get wt down; she decr the [employer]- 1/2 tab Qam on her own. ~  9/12: Wt down 12# to 214#; states on meds regularly (Metform500Bid & Glim2mg -1/2am), BS=387 & A1c=10.7, Pharm says last refilled 30d supplies 09/19/10!!! ~  1/13: Wt back up 11# to 225#; on  Metform500Bid & Glimep2mg /d confirmed by Pharm> BS=284, A1c=8.8; REC> incr Metform1000Bid & Glimep4mg /d. ~  Labs 4/13 on Metform2Bid+Glimep4 showed FSBS= 215, A1c= 7.9; we reviewed options & cost of additional meds; SHE MUST GET WT DOWN!!! ~  Labs 7/13 on Metform500-2Bid+Glim4 showed BS= 213, A1c= 9.2; Rec to incr Glim4mg Bid, get on diet, get wt down further... ~  Labs 11/13 on Metform2Bid+Glim4Bid showed BS= 146, A1c= 6.4.Marland KitchenMarland Kitchen Continue same. ~  Labs 3/14 on Metform2Bid+Glim4Bid showed BS= 159, A1c= 6.7.Marland KitchenMarland Kitchen Still hasn't lost any wt & we reviewed diet, exercise, wt reduction... ~  Labs 9/14 on Metform500-2Bid+Glim4Bid showed BS= 339, A1c= 12.0.Marland KitchenMarland Kitchen and she has lost 14#; needs insulin, refer to Endocrine, DrGherghe... ~  She saw DrGherghe who added NPH insulin 12u Bid... ~  Labs 12/14 on Metform500-2Bid, Glimep4mg - 1/2Qam&1Qpm, NPH12uBid showed BS= 146, A1c=7.4.Marland KitchenMarland Kitchen Much improved, continue same, get wt down...  OBESITY (ICD-278.00) - we disussed wt reduction & referral to [facility] Nutrition... ~  review of paper chart shows weights 180-200 in late 90's  ~  weight 2/12 = 219# ==> 2 wk f/u recheck wt=221# & we reviewed the diet recommendations. ~  Weight 4/12 = 221# but she notes "I'm losing inches"... Discussed goal of losing 1 lb every week. ~  Weight 6/12 = 226# & we discussed need for a REAL diet & exercise program. ~  Weight 9/12 = 214# ~  Weight 1/13 = 225#... We reviewed diet, exercise, wt reduction program. ~  Weight 4/13 = 229# ~  Weight 7/13 = 221# ~  Weight 11/13 = 219# ~  Weight 3/14 = 220# ~  Weight 9/14 = 205# ~  Weight 12/14 = 216#  HIATAL HERNIA (ICD-553.3) - she had EGD by DrPatterson 1999 w/ sm HH, otherw neg... ~  [place] showed 2 sm  GB polyps, no stones, no inflamm etc... ~  1/13:  She is c/o reflux symptoms but declines GI referral; we discussed trial OMEPRAZOLE $RemoveBeforeDE'40mg'IxYMxVKiFkXojNq$ /d, antireflux regimen etc (this helped).. ~  She stopped the Omep40 on her own & asked to use OTC Prilosec20 as  needed for symptoms...  ?Hx of CROHN'S DISEASE, SMALL INTESTINE (ICD-555.0) - followed by DrPatterson & last colonoscopy was 1999 showing normal terminal ileum w/o signs of inflamm bowel dis... he felt that she prob had IBS and she was given trial Librax... ~  CT Abd/ Pelvis in 1995 was neg x for complex right ovarian cyst (referred to DrSSmith). ~  note: she had a neg colonoscopy 1995 by DrPerry...  BORDERLINE LFTs> likely FATTY LIVER DISEASE> we discussed this condition & the need to lose weight. ~  Labs 6/12 showed SGOT= 40, SGPT= 55 ~  Labs 11/13 showed SGOT= 45, SGPT= 81... Must diet, get wt down!!! ~  Labs 9/14 showed SGOT=39, SGPT=67... We discussed NAFLD & need to lose more wt...  Hx of OVARIAN CYST (ICD-620.2) - prev GYN eval by DrSigSmith ~  9/14:  She is overdue for f/u pelvic exam & promises to get new Gyn...  Hx of DERMATITIS (ICD-692.9) PERSONAL HISTORY OF ALLERGY TO LATEX (ICD-V15.07) - sent to [place] for eval in 1997.  HEALTH MAINTENANCE >> ~  DM:  Needs DM eye exam & advised to sched at her convenience;  Monofilament test is normal... ~  GI:  Followed by DrPatterson w/ prob IBS, neg colonoscopy 1999, now 38y/o & no GI symptoms... ~  GYN:  It's been >74yrs since her last GYN check & PAP, advised to sched at her convenience... ~  Immuniz:  Advised to get yearly seasonal flu vaccine each fall;  Needs baseline TDAP (wants to wait);  Needs baseline Pneumovax as well (Diabetic).   Past Surgical History  Procedure Laterality Date  . Tonsillectomy and adenoidectomy    . Nasal septum surgery      Outpatient Encounter Prescriptions as of 04/22/2013  Medication Sig  . aspirin EC 81 MG tablet Take 1 tablet (81 mg total) by mouth daily.  . diphenhydrAMINE (SOMINEX) 25 MG tablet Take 25 mg by mouth at bedtime as needed.  Marland Kitchen gemfibrozil (LOPID) 600 MG tablet Take 1 tablet (600 mg total) by mouth 2 (two) times daily before a meal.  . glimepiride (AMARYL) 4 MG tablet Take 2 mg in  am and 4 mg in pm  . guaiFENesin (MUCINEX) 600 MG 12 hr tablet Take 1,200 mg by mouth 2 (two) times daily as needed.   . insulin NPH (HUMULIN N) 100 UNIT/ML injection Inject 12 Units into the skin 2 (two) times daily at 8 am and 10 pm.  . metFORMIN (GLUCOPHAGE) 500 MG tablet TAKE 2 TABLETS (1,000 MG TOTAL) BY MOUTH 2 (TWO) TIMES DAILY WITH A MEAL.  . [DISCONTINUED] glucose blood test strip Test daily as needed  . [DISCONTINUED] Insulin Pen Needle (CLICKFINE PEN NEEDLES) 32G X 4 MM MISC Use 2x a day    Allergies  Allergen Reactions  . Latex     REACTION: rash    Current Medications, Allergies, Past Medical History, Past Surgical History, Family History, and Social History were reviewed in [employer] record.     Review of Systems         See HPI - all other systems neg except as noted... The patient complains of hoarseness and prolonged cough.  The patient denies anorexia, fever, weight  loss, weight gain, vision loss, decreased hearing, chest pain, syncope, dyspnea on exertion, peripheral edema, headaches, hemoptysis, abdominal pain, melena, hematochezia, severe indigestion/heartburn, hematuria, incontinence, muscle weakness, suspicious skin lesions, transient blindness, difficulty walking, depression, unusual weight change, abnormal bleeding, enlarged lymph nodes, and angioedema.     Objective:   Physical Exam     WD, Obese, 38 y/o WF in NAD... she is somewhat anxious... GENERAL:  Alert & oriented; pleasant & cooperative... HEENT:  [place]/AT, EOM-wnl, PERRLA, Fundi-benign, EACs-clear, TMs-wnl, NOSE-clear, THROAT-clear & wnl. NECK:  Supple w/ full ROM; no JVD; normal carotid impulses w/o bruits; no thyromegaly or nodules palpated; no lymphadenopathy. CHEST:  Chest clear to P & A; without wheezes/ rales/ or rhonchi heard... HEART:  Regular Rhythm; without murmurs/ rubs/ or gallops. ABDOMEN:  Obese, soft & nontender; normal bowel sounds; no organomegaly or masses  detected. EXT: without deformities or arthritic changes; no varicose veins/ venous insuffic/ or edema. NEURO:  CN's intact; motor testing normal; sensory testing normal; gait normal & balance OK. DERM:  No lesions noted; no rash etc...  RADIOLOGY DATA:  Reviewed in the EPIC EMR & discussed w/ the patient...    LABORATORY DATA:  Reviewed in the EPIC EMR & discussed w/ the patient...   Assessment & Plan:    DM>  On Meform1000Bid, Glimep-2AM&4PM, NPH12uBid per DrGherghe> A1c is much improved down to 7.4 today...   Ex-smoker>  She has remained quit & congratulated on this accomplishment!...  CHOL>  Her FLP is abn & all parameters are off; taking Gemfib600Bid regularly now & sl improved but not at goals she does not want statin rx- therefore same med, better diet, GET WT DOWN!!!  OBESITY>  Everything hinges upon her losing weight & she needs to be serious about diet & exercise; she has no insurance & can't afford dietary counseling or Bariatric approach...  HH>  She notes improved GI symptoms & denies GERD, dysphagia, etc...  Crohn's>  Stable w/o abd pain, N V D or blood seen...  Fatty Liver dis>  LFTs are sl elev & we reviewed the need for wt reduction etc...  Other medical issues as noted... Needs Pneumovax, TDAP, GYN check, etc but she wants to wait- no insurance & asked to look into medicaid application (hasn't done this yet?)... ~  12/14:  TDAP given today...   Patient's Medications  New Prescriptions   No medications on file  Previous Medications   ASPIRIN EC 81 MG TABLET    Take 1 tablet (81 mg total) by mouth daily.   DIPHENHYDRAMINE (SOMINEX) 25 MG TABLET    Take 25 mg by mouth at bedtime as needed.   GEMFIBROZIL (LOPID) 600 MG TABLET    Take 1 tablet (600 mg total) by mouth 2 (two) times daily before a meal.   GLIMEPIRIDE (AMARYL) 4 MG TABLET    Take 2 mg in am and 4 mg in pm   GUAIFENESIN (MUCINEX) 600 MG 12 HR TABLET    Take 1,200 mg by mouth 2 (two) times daily as  needed.    INSULIN NPH (HUMULIN N) 100 UNIT/ML INJECTION    Inject 12 Units into the skin 2 (two) times daily at 8 am and 10 pm.   METFORMIN (GLUCOPHAGE) 500 MG TABLET    TAKE 2 TABLETS (1,000 MG TOTAL) BY MOUTH 2 (TWO) TIMES DAILY WITH A MEAL.  Modified Medications   No medications on file  Discontinued Medications   GLUCOSE BLOOD TEST STRIP    Test daily as  needed   INSULIN PEN NEEDLE (CLICKFINE PEN NEEDLES) 32G X 4 MM MISC    Use 2x a day

## 2013-06-05 ENCOUNTER — Ambulatory Visit: Payer: Self-pay | Admitting: Internal Medicine

## 2013-06-17 ENCOUNTER — Ambulatory Visit: Payer: Self-pay | Admitting: Internal Medicine

## 2013-06-21 ENCOUNTER — Other Ambulatory Visit: Payer: Self-pay | Admitting: Pulmonary Disease

## 2013-06-23 ENCOUNTER — Encounter: Payer: Self-pay | Admitting: Internal Medicine

## 2013-06-23 ENCOUNTER — Ambulatory Visit (INDEPENDENT_AMBULATORY_CARE_PROVIDER_SITE_OTHER): Payer: Self-pay | Admitting: Internal Medicine

## 2013-06-23 VITALS — BP 122/84 | HR 112 | Temp 98.4°F | Resp 12 | Wt 221.0 lb

## 2013-06-23 DIAGNOSIS — E1165 Type 2 diabetes mellitus with hyperglycemia: Principal | ICD-10-CM

## 2013-06-23 DIAGNOSIS — IMO0001 Reserved for inherently not codable concepts without codable children: Secondary | ICD-10-CM

## 2013-06-23 NOTE — Patient Instructions (Signed)
Continue NPH 12 units in am and 12 units in HS Continue Metformin 1000 mg bid Continue Amaryl 2 mg in am and 4 mg at dinner  Please come back for a follow-up appointment in 4 months

## 2013-06-23 NOTE — Progress Notes (Signed)
Patient ID: Adrienne Goodwin, female   DOB: February 13, 1976, 38 y.o.   MRN: [id number]  HPI: Adrienne Goodwin is a 38 y.o.-year-old female, returning for f/u for DM2, dx 2012, insulin-dependent since 01/2013, uncontrolled, without complications. Last visit 3 mo ago.  Last hemoglobin A1c was: Lab Results  Component Value Date   HGBA1C 7.4* 04/22/2013   HGBA1C 12.0* 01/14/2013   HGBA1C 6.7* 07/11/2012  She mentions that  before the Hba1c check in 12/2012 she did not exercise and eating fast food.   Pt is on a regimen of: - Metformin 1000 mg po bid - Amaryl 2 mg in am and 4 mg po in pm - NPH 12 units bid Did not try other meds. 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 They do not think they can come to see me since they have to pay 140$ for a visit.   Pt checks her sugars 1-2x a day and they are (forgot her CBG log): - am: 109, 134 >> 120-130 - Before brunch: 125-147 (one at 181) >> n/c - 2h after brunch: 114-153 >> 140-160 - before dinner: 54, 56, 77, 89, 98, 164 >> n/c - 2h after dinner: 120, 170 >> 140-160 - bedtime: 107 >> 120-140  Lowest sugar was 70s x2 in pm in 03/2013, not since; she has hypoglycemia awareness - 90. Highest sugar was  211.  Pt has 2 meals a day: - Brunch (12-1 pm): eating out chop steak + baked potato + salad - Dinner (6-6:30): vegetables - 3 for dinner - Snacks: PB + Crackers Drinks water and sweet tea - replacement for sodas.   - no CKD, last BUN/creatinine:  Lab Results  Component Value Date   BUN 13 04/22/2013   CREATININE 0.8 04/22/2013  Urine ACR 4.5 01/14/2013, prev. 0.4. She is not on an ACEI. - Has HTG - last set of lipids: Lab Results  Component Value Date   CHOL 238* 04/22/2013   HDL 39.20 04/22/2013   LDLDIRECT 153.1 04/22/2013   TRIG 357.0* 04/22/2013   CHOLHDL 6 04/22/2013  On Gemfibrozil. - last eye exam was in 2013 >> she is going to have a  new one soon (this or next mo). No DR. She needs to pay out of pocket for it. - no numbness and tingling in her feet. Foot exam normal by PCP 01/14/2013.   I reviewed her chart and she also has a history of Crohn's ds., hiatal hernia. Had a recent 2D ECHO - nl EF, mild MR.  ROS: Constitutional: no weight gain (but per our scale she gained 6 lbs)/loss, no fatigue Eyes: no blurry vision, no xerophthalmia ENT: no sore throat, no nodules palpated in throat, no dysphagia/odynophagia, no hoarseness Cardiovascular: no CP/SOB/palpitations/leg swelling Respiratory: no cough/SOB Gastrointestinal: no N/V/D/C, + heartburn Musculoskeletal: no muscle/joint aches Skin: no rashes Neurological: no tremors/numbness/tingling/dizziness  I reviewed pt's medications, allergies, PMH, social hx, family hx and no changes required, except as mentioned above.  PE: BP 122/84  Pulse 112  Temp(Src) 98.4 F (36.9 C) (Oral)  Resp 12  Wt 221 lb (100.245 kg)  SpO2 97% Wt Readings from Last 3 Encounters:  06/23/13 221 lb (100.245 kg)  04/22/13 215 lb 9.6 oz (97.796 kg)  03/06/13 207 lb 8 oz (94.121 kg)   Constitutional: overweight, in NAD Eyes: PERRLA, EOMI, no exophthalmos ENT: tonsillar enlargement, moist mucous membranes, no thyromegaly, no cervical lymphadenopathy Cardiovascular:  RRR, No MRG Respiratory: CTA B Gastrointestinal: abdomen soft, NT, ND, BS+ Musculoskeletal: no deformities, strength intact in all 4 Skin: moist, warm, rash on face (? Eczema)  ASSESSMENT: 1. DM2, insulin-dependent, uncontrolled, without complications  PLAN:  1. Patient with a 2-year h/o DM2, that became uncontrolled due to change in dietary patterns (fast food) and lack of exercise. Sugars much improved after adding NPH insulin to her regimen. She had some lows in the 50s, not anymore.  - for now I suggested to: Continue NPH 12 units in am and 12 units in HS Continue Metformin 1000 mg bid Continue Amaryl 2 mg in am and 4  mg at dinner - continue checking her sugars at different times of the day - check 2 times a day, rotating checks - pt had flu vaccine at visit with PCP 01/14/13 - Return to clinic in 4 mo with sugar log. Will check HbA1c then if not checked by PCP at next visit.

## 2013-07-14 ENCOUNTER — Ambulatory Visit: Payer: Self-pay | Admitting: Pulmonary Disease

## 2013-08-22 ENCOUNTER — Other Ambulatory Visit: Payer: Self-pay | Admitting: Internal Medicine

## 2013-10-21 ENCOUNTER — Ambulatory Visit: Payer: Self-pay | Admitting: Pulmonary Disease

## 2013-10-28 ENCOUNTER — Encounter: Payer: Self-pay | Admitting: Internal Medicine

## 2013-10-28 ENCOUNTER — Ambulatory Visit (INDEPENDENT_AMBULATORY_CARE_PROVIDER_SITE_OTHER): Payer: Self-pay | Admitting: Internal Medicine

## 2013-10-28 VITALS — BP 110/78 | HR 105 | Temp 98.7°F | Resp 12 | Wt 216.8 lb

## 2013-10-28 DIAGNOSIS — IMO0001 Reserved for inherently not codable concepts without codable children: Secondary | ICD-10-CM

## 2013-10-28 DIAGNOSIS — E1165 Type 2 diabetes mellitus with hyperglycemia: Principal | ICD-10-CM

## 2013-10-28 LAB — HEMOGLOBIN A1C: Hgb A1c MFr Bld: 9 % — ABNORMAL HIGH (ref 4.6–6.5)

## 2013-10-28 NOTE — Progress Notes (Signed)
Patient ID: Adrienne NearingCatherine Goodwin, female   DOB: 09/16/1975, 38 y.o.   MRN: [id number]  HPI: Adrienne NearingCatherine Deskin is a 38 y.o.-year-old female, returning for f/u for DM2, dx 2012, insulin-dependent since 01/2013, uncontrolled, without complications. Last visit 4 mo ago.   Pt's mom died in 07/2013. She is very sad about this and cries during the appt.  She is looking for a job.  Last hemoglobin A1c was: Lab Results  Component Value Date   HGBA1C 7.4* 04/22/2013   HGBA1C 12.0* 01/14/2013   HGBA1C 6.7* 07/11/2012  She mentions that  before the Hba1c check in 12/2012 she did not exercise and eating fast food.   Pt is on a regimen of: - Metformin 1000 mg po bid - Amaryl 2 mg in am and 4 mg po in pm - NPH 12 units bid Did not try other meds. She does not have insurance, has limited finances.   Pt did not check her sugars since last visit, but she did take her meds regularly. We reviewed the sugars from last time and they were at goal: - am: 109, 134 >> 120-130 - Before brunch: 125-147 (one at 181) >> n/c - 2h after brunch: 114-153 >> 140-160 - before dinner: 54, 56, 77, 89, 98, 164 >> n/c - 2h after dinner: 120, 170 >> 140-160 - bedtime: 107 >> 120-140 ? lows; she has hypoglycemia awareness - 90. ? Highest sugars.   Pt has 2 meals a day: - Brunch (12-1 pm): eating out chop steak + baked potato + salad - Dinner (6-6:30): vegetables - 3 for dinner - Snacks: PB + Crackers Drinks water and sweet tea - replacement for sodas.   - no CKD, last BUN/creatinine:  Lab Results  Component Value Date   BUN 13 04/22/2013   CREATININE 0.8 04/22/2013  Urine ACR 4.5 01/14/2013, prev. 0.4. She is not on an ACEI. - Has HTG - last set of lipids: Lab Results  Component Value Date   CHOL 238* 04/22/2013   HDL 39.20 04/22/2013   LDLDIRECT 153.1 04/22/2013   TRIG 357.0* 04/22/2013   CHOLHDL 6 04/22/2013  On Gemfibrozil. - last eye exam was in 2013 >> she is going to have a new one soon (Dr Robb MatarBernstein). No DR.  She needs to pay out of pocket for it. - no numbness and tingling in her feet. Foot exam normal by PCP 01/14/2013.   I reviewed her chart and she also has a history of Crohn's ds., hiatal hernia. Had a recent 2D ECHO - nl EF, mild MR.  ROS: Constitutional: no weight gain/loss, no fatigue, + poor sleep Eyes: no blurry vision, no xerophthalmia ENT: no sore throat, no nodules palpated in throat, no dysphagia/odynophagia, no hoarseness Cardiovascular: no CP/SOB/palpitations/leg swelling Respiratory: no cough/SOB Gastrointestinal: no N/V/D/C/heartburn Musculoskeletal: no muscle/joint aches Skin: no rashes Neurological: no tremors/numbness/tingling/dizziness  I reviewed pt's medications, allergies, PMH, social hx, family hx and no changes required, except as mentioned above.  PE: BP 110/78  Pulse 105  Temp(Src) 98.7 F (37.1 C) (Oral)  Resp 12  Wt 216 lb 12.8 oz (98.34 kg)  SpO2 97% Wt Readings from Last 3 Encounters:  10/28/13 216 lb 12.8 oz (98.34 kg)  06/23/13 221 lb (100.245 kg)  04/22/13 215 lb 9.6 oz (97.796 kg)   Constitutional: overweight, in NAD Eyes: PERRLA, EOMI, no exophthalmos ENT: tonsillar enlargement, moist mucous membranes, no thyromegaly, no cervical lymphadenopathy Cardiovascular: RRR, No MRG Respiratory: CTA B Gastrointestinal: abdomen soft, NT, ND, BS+ Musculoskeletal: no deformities,  strength intact in all 4 Skin: moist, warm, rash on face (? Eczema)  ASSESSMENT: 1. DM2, insulin-dependent, uncontrolled, without complications  PLAN:  1. Patient with a 2-year h/o DM2, that became uncontrolled due to change in dietary patterns (fast food) and lack of exercise. Sugars much improved after adding NPH insulin to her regimen. She did not check sugars since last visit, as her mother was sick and then died in 07/2013. She is depressed about this and cries during the appt. Since no sugars available, I advised her to start checking sugars and will also check a HbA1c  today, but I cannot make further changes in her regimen: Continue NPH 12 units in am and 12 units in HS Continue Metformin 1000 mg bid Continue Amaryl 2 mg in am and 4 mg at dinner - start checking her sugars at different times of the day - check 2 times a day, rotating checks - advised her again to schedule a new eye exam - Return to clinic in 3 mo with sugar log.   Office Visit on 10/28/2013  Component Date Value Ref Range Status  . Hemoglobin A1C 10/28/2013 9.0* 4.6 - 6.5 % Final   Glycemic Control Guidelines for People with Diabetes:Non Diabetic:  <6%Goal of Therapy: <7%Additional Action Suggested:  >8%    HbA1c higher. Will advise to pay attention to diet and to increase the insulin to 14 units bid.

## 2013-10-28 NOTE — Patient Instructions (Addendum)
Continue NPH 12 units in am and 12 units at bedtime Continue Metformin 1000 mg 2x a day Continue Amaryl 2 mg in am and 4 mg at dinner  Start checking sugars again: 2x a day >> write them in log and bring log at next visit.  Please stop at the lab.  Please come back for a follow-up appointment in 3 months.

## 2013-11-09 LAB — HM DIABETES EYE EXAM

## 2013-11-10 ENCOUNTER — Encounter: Payer: Self-pay | Admitting: Internal Medicine

## 2013-12-20 ENCOUNTER — Other Ambulatory Visit: Payer: Self-pay | Admitting: Pulmonary Disease

## 2013-12-21 ENCOUNTER — Encounter: Payer: Self-pay | Admitting: Internal Medicine

## 2013-12-22 ENCOUNTER — Other Ambulatory Visit: Payer: Self-pay | Admitting: *Deleted

## 2013-12-22 MED ORDER — GLIMEPIRIDE 4 MG PO TABS: ORAL_TABLET | ORAL | Status: DC

## 2014-01-28 ENCOUNTER — Ambulatory Visit: Payer: Self-pay | Admitting: Internal Medicine

## 2014-06-11 IMAGING — CR DG CHEST 2V
2 series · 2 of 2 positions shown · non-contrast
Comparison: 10/12/2010

CLINICAL DATA: Yearly followup, history type 2 diabetes, former
smoker

EXAM:
CHEST  2 VIEW

[view not recorded (1 of 2)]
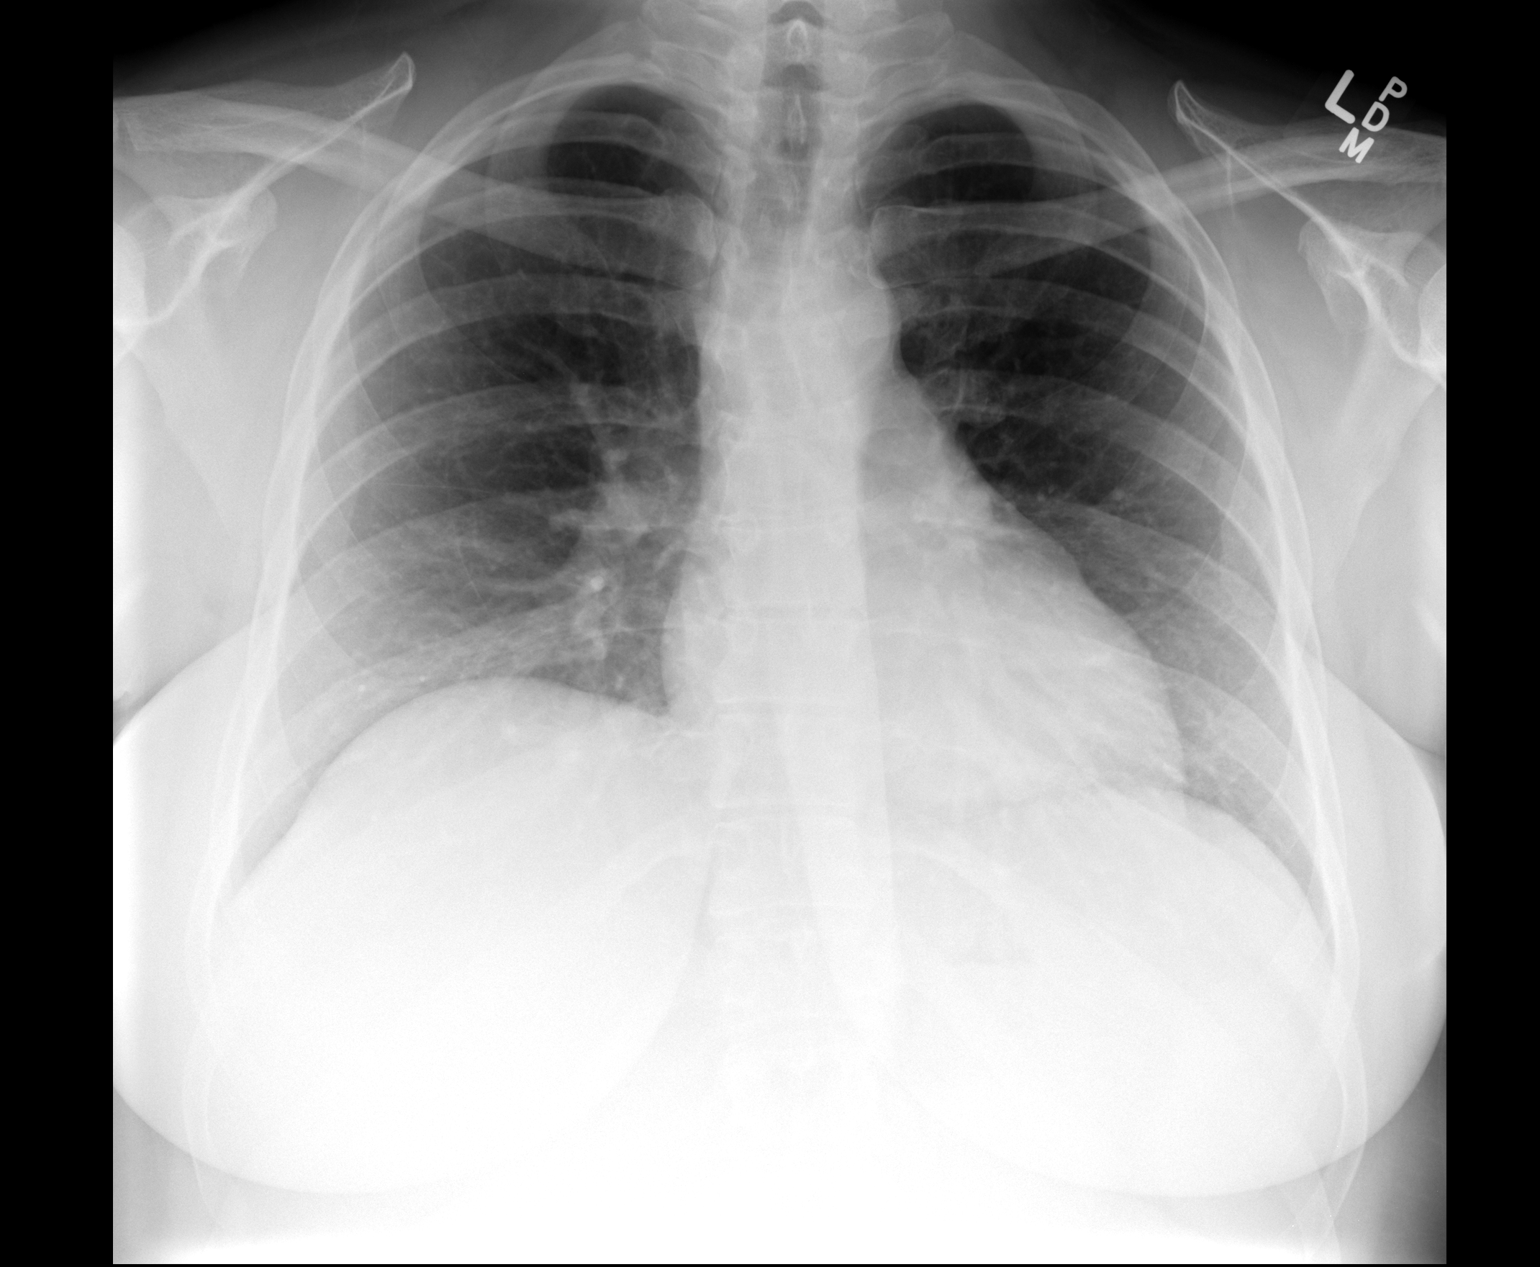

[view not recorded (2 of 2)]
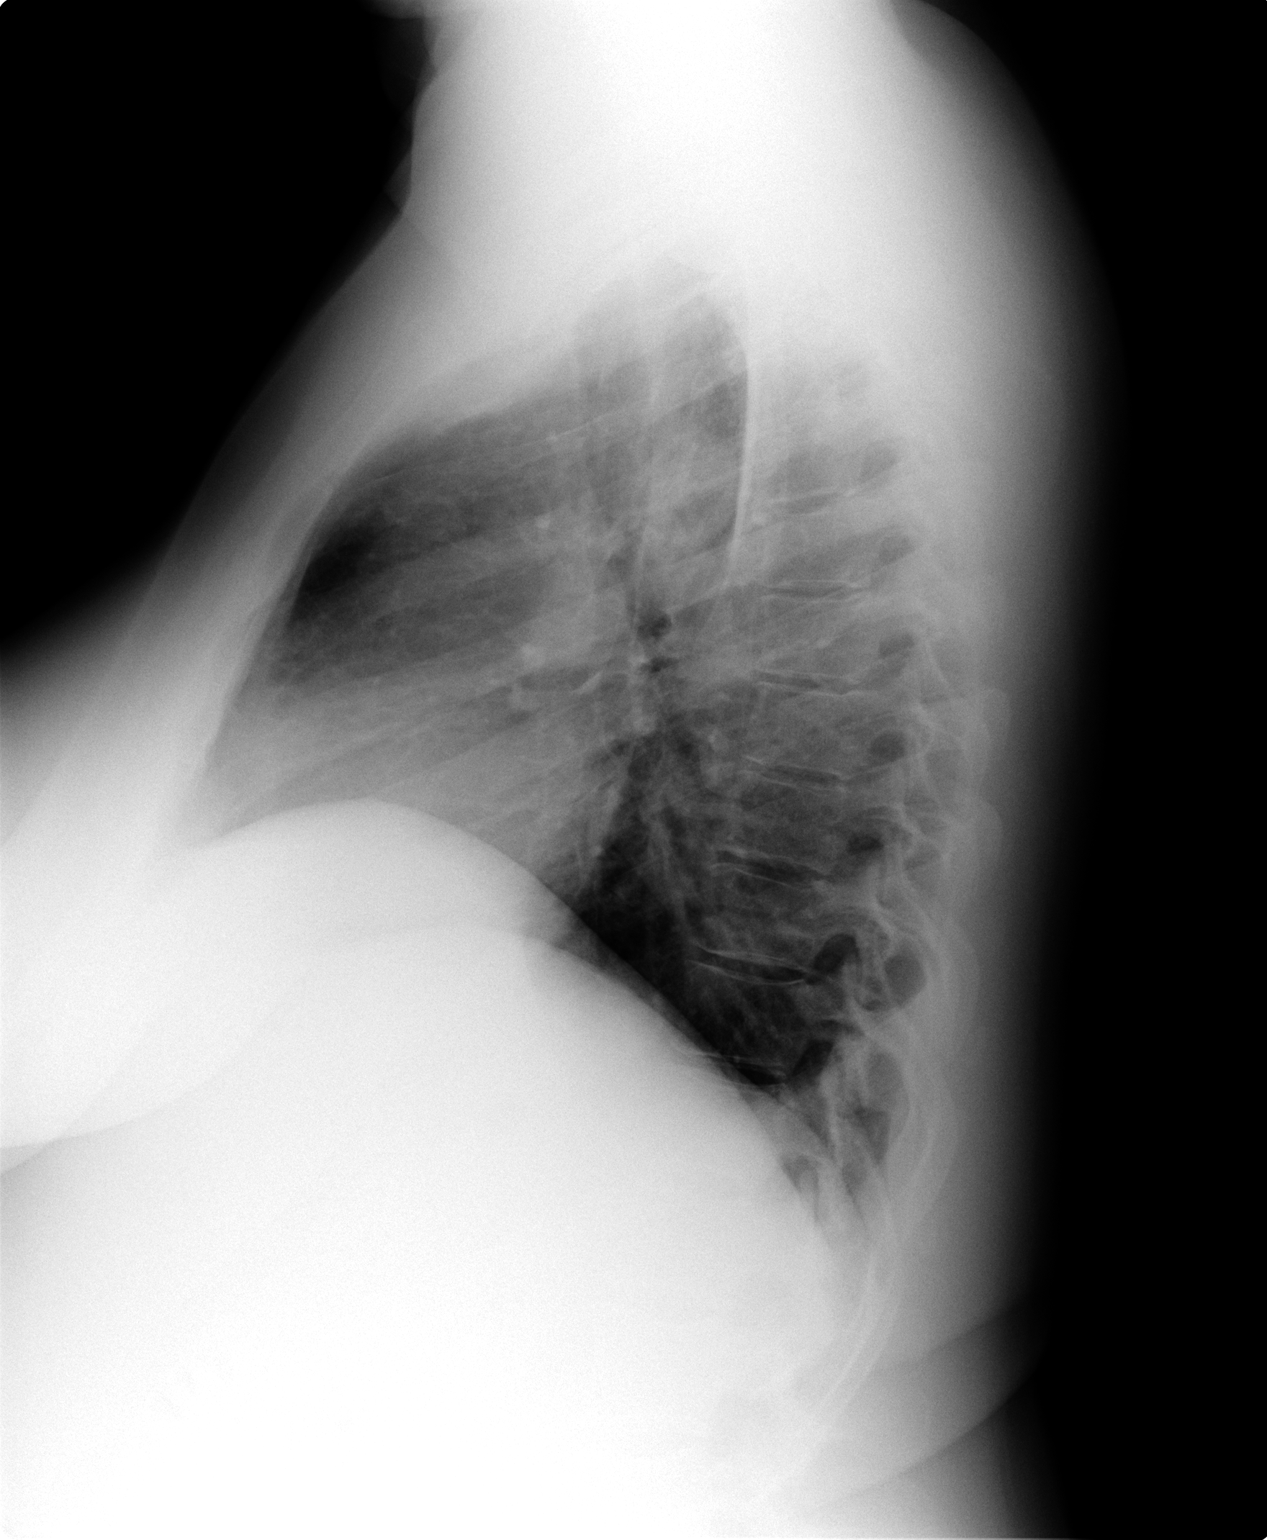

[2 of 2 positions shown; findings below may reference images not displayed]

FINDINGS: Normal heart size, mediastinal contours, and pulmonary vascularity.

Lungs clear.

Bones unremarkable.

No pneumothorax.
IMPRESSION: Normal exam.

## 2014-10-19 ENCOUNTER — Other Ambulatory Visit: Payer: Self-pay

## 2024-02-21 ENCOUNTER — Encounter (HOSPITAL_BASED_OUTPATIENT_CLINIC_OR_DEPARTMENT_OTHER): Payer: Self-pay | Admitting: Student

## 2024-02-21 ENCOUNTER — Ambulatory Visit (HOSPITAL_BASED_OUTPATIENT_CLINIC_OR_DEPARTMENT_OTHER): Admitting: Student

## 2024-02-21 VITALS — BP 132/85 | HR 100 | Temp 98.5°F | Resp 18 | Ht 66.14 in | Wt 160.9 lb

## 2024-02-21 DIAGNOSIS — E1165 Type 2 diabetes mellitus with hyperglycemia: Secondary | ICD-10-CM

## 2024-02-21 DIAGNOSIS — Z1231 Encounter for screening mammogram for malignant neoplasm of breast: Secondary | ICD-10-CM

## 2024-02-21 DIAGNOSIS — H269 Unspecified cataract: Secondary | ICD-10-CM

## 2024-02-21 DIAGNOSIS — Z7984 Long term (current) use of oral hypoglycemic drugs: Secondary | ICD-10-CM

## 2024-02-21 DIAGNOSIS — K5000 Crohn's disease of small intestine without complications: Secondary | ICD-10-CM | POA: Diagnosis not present

## 2024-02-21 DIAGNOSIS — Z136 Encounter for screening for cardiovascular disorders: Secondary | ICD-10-CM

## 2024-02-21 DIAGNOSIS — Z13220 Encounter for screening for lipoid disorders: Secondary | ICD-10-CM

## 2024-02-21 DIAGNOSIS — Z7689 Persons encountering health services in other specified circumstances: Secondary | ICD-10-CM

## 2024-02-21 LAB — POCT GLYCOSYLATED HEMOGLOBIN (HGB A1C): HbA1c POC (<> result, manual entry): 11 % (ref 4.0–5.6)

## 2024-02-21 MED ORDER — METFORMIN HCL ER 500 MG PO TB24: 500.0000 mg | ORAL_TABLET | Freq: Every day | ORAL | 6 refills | Status: DC

## 2024-02-21 MED ORDER — LANCET DEVICE MISC: 1.0000 | 0 refills | Status: AC

## 2024-02-21 MED ORDER — BLOOD GLUCOSE MONITORING SUPPL DEVI: 1.0000 | 0 refills | Status: AC

## 2024-02-21 MED ORDER — INSULIN GLARGINE-YFGN 100 UNIT/ML ~~LOC~~ SOPN: 10.0000 [IU] | PEN_INJECTOR | Freq: Every day | SUBCUTANEOUS | 99 refills | Status: DC

## 2024-02-21 MED ORDER — BLOOD GLUCOSE TEST VI STRP: 1.0000 | ORAL_STRIP | 99 refills | Status: AC

## 2024-02-21 MED ORDER — METFORMIN HCL ER 500 MG PO TB24
500.0000 mg | ORAL_TABLET | Freq: Every day | ORAL | 6 refills | Status: AC
Start: 2024-02-21 — End: ?

## 2024-02-21 MED ORDER — LANCETS MISC: 1.0000 | 99 refills | Status: AC

## 2024-02-21 NOTE — Patient Instructions (Addendum)
 It was nice to see you today!  As we discussed in clinic:  Metformin : Metformin  Titration Schedule: Week 1: Take 1 tablet by mouth (500 mg) with breakfast daily. Week 2: Take 2 tablets by mouth (1000 mg) with breakfast daily. Week 3: Take 2 tablets by mouth (1000 mg) with breakfast daily and take 1 tablet by mouth (500 mg) with dinner daily. Week 4: Take 2 tablets by mouth (1000 mg) with breakfast daily and take 2 tablet by mouth (1000 mg) with dinner daily.  Instructions for insulin : Inject 10 Units into the skin every morning. Increase by 2 units every 2-3 days until fasting glucose (also called blood sugar) is 150 mg/dL or less. Do not increase past a daily dose of 25 units daily without contacting me. Do not increase your insulin  dose again if you develop any low blood sugar during the day (<70 mg/dL).    Call if blood sugar is less than 70 or consistently above 250  Take a 15 gm snack of carbohydrate at bedtime before you go to sleep if your blood sugar is less than 100.  If you are going to fast after midnight for a test or procedure, ask your physician for instructions on how to reduce/decrease your insulin  dose.  If your blood sugar is low during the day here is some info to help:  If you feel your sugar is low, test your sugar to be sure  If your sugar is low (less than 70), then take 15 grams of a fast acting Carbohydrate (3-4 glucose tablets or glucose gel or 4 ounces of juice or regular soda)  Recheck your sugar 15 min after treating with sugar to make sure it is more than 70  If sugar is still less than 70, treat again with 15 grams of carbohydrate  Don't drive the hour of having hypoglycemia (low blood sugar)  If unconscious/unable to eat or drink by mouth, family should know to call 911.  If you have any problems before your next visit feel free to message me via MyChart (minor issues or questions) or call the office, otherwise you may reach out to schedule an office  visit.  Thank you! Edis Huish, PA-C

## 2024-02-21 NOTE — Assessment & Plan Note (Signed)
 Bilateral cataracts causing significant visual impairment, with surgery postponed due to uncontrolled diabetes. The goal is to stabilize blood glucose levels to proceed with cataract surgery. Surgery on November 12th is not feasible due to current blood glucose levels. - Advise postponement of cataract surgery scheduled for November 12th until blood glucose is controlled. - Coordinate with the nurse practitioner regarding blood glucose management and surgery rescheduling.

## 2024-02-21 NOTE — Progress Notes (Signed)
 New Patient Office Visit  Subjective    Patient ID: Adrienne Goodwin, female    DOB: [birth year]  Age: 48 y.o. MRN: [id number]  CC:  Chief Complaint  Patient presents with   Diabetes    Discussed the use of AI scribe software for clinical note transcription with the patient, who gave verbal consent to proceed.  History of Present Illness   Adrienne Goodwin is a 48 year old female with diabetes and Crohn's disease who presents for management of uncontrolled diabetes and preoperative clearance for cataract surgery.  She has a history of diabetes for about ten years and has been previously managed with metformin , glimepiride , and insulin . Her blood sugar was noted to be 324 mg/dL during a preoperative assessment, leading to the postponement of her cataract surgery until her glucose levels are better controlled. She has experienced a significant weight loss of 56 pounds since her last medical visit. She has been consuming mostly water for the past four weeks, with occasional sweet tea, and has stopped drinking Coke to help manage her blood sugar levels. She does not currently have a way to check her blood sugar at home. She reports no current symptoms of severe headaches or frequent urination. She experienced severe headaches and frequent urination at the time of her initial diabetes diagnosis.  She has a history of Crohn's disease, which her mother also had. She has not been under regular medical care for the past ten years due to a lack of trust in local healthcare providers and insurance issues. She has not been under the care of a gastroenterologist for her Crohn's disease.  She occasionally smokes cigarettes and vapes daily. She reduced her cigarette use about five years ago and has never smoked more than half a pack per day. She has not seen a doctor in ten years, except for emergency room visits.     Screenings:  Colon Cancer: indicated Lung Cancer: not indicated Breast Cancer:  indicated Diabetes: A1c: 11  Ophthalmology: Needs diabetic eye exam.  Foot: indicated at next visit HLD: indicated   Outpatient Encounter Medications as of 02/21/2024  Medication Sig   Blood Glucose Monitoring Suppl DEVI 1 each by Does not apply route as directed. Dispense based on patient and insurance preference. Use up to four times daily as directed. (FOR ICD-10 E10.9, E11.9).   DM-Doxylamine-Acetaminophen (NIGHT-TIME COLD/FLU RELIEF PO) Take by mouth.   Glucose Blood (BLOOD GLUCOSE TEST STRIPS) STRP 1 each by Does not apply route as directed. Dispense based on patient and insurance preference. Use up to four times daily as directed. (FOR ICD-10 E10.9, E11.9).   guaiFENesin (MUCINEX) 600 MG 12 hr tablet Take 1,200 mg by mouth 2 (two) times daily as needed.    insulin  glargine-yfgn (SEMGLEE) 100 UNIT/ML Pen Inject 10 Units into the skin daily. Increase by 2 units every 2-3 days until fasting glucose (also called blood sugar) is 130 mg/dL. Do not increase past a daily dose of 25 units daily without contacting me. Do not increase your insulin  dose again if you develop any low blood sugar during the day (<70 mg/dL).   Lancet Device MISC 1 each by Does not apply route as directed. Dispense based on patient and insurance preference. Use up to four times daily as directed. (FOR ICD-10 E10.9, E11.9).   Lancets MISC 1 each by Does not apply route as directed. Dispense based on patient and insurance preference. Use up to four times daily as directed. (FOR ICD-10 E10.9, E11.9).   [  DISCONTINUED] metFORMIN  (GLUCOPHAGE -XR) 500 MG 24 hr tablet Take 1 tablet (500 mg total) by mouth daily with breakfast. Metformin  Titration Schedule: Week 1: Take 1 tablet by mouth (500 mg) with breakfast daily. Week 2: Take 2 tablets by mouth (1000 mg) with breakfast daily. Week 3: Take 2 tablets by mouth (1000 mg) with breakfast daily and take 1 tablet by mouth (500 mg) with dinner daily. Week 4: Take 2 tablets by mouth (1000  mg) with breakfast daily and take 2 tablet by mouth (1000 mg) with dinner daily.   aspirin EC 81 MG tablet Take 1 tablet (81 mg total) by mouth daily.   B-D INS SYRINGE 0.5CC/31GX5/16 31G X 5/16 0.5 ML MISC USE AS DIRECTED   metFORMIN  (GLUCOPHAGE -XR) 500 MG 24 hr tablet Take 1 tablet (500 mg total) by mouth daily with breakfast. Follow the titration schedule: Week 1: Take 1 tablet by mouth (500 mg) with breakfast daily. Week 2: Take 2 tablets by mouth (1000 mg) with breakfast daily. Week 3: Take 2 tablets by mouth (1000 mg) with breakfast daily and take 1 tablet by mouth (500 mg) with dinner daily. Week 4: Take 2 tablets by mouth (1000 mg) with breakfast daily and take 2 tablet by mouth (1000 mg) with dinner daily.   [DISCONTINUED] diphenhydrAMINE (SOMINEX) 25 MG tablet Take 25 mg by mouth at bedtime as needed.   [DISCONTINUED] gemfibrozil  (LOPID ) 600 MG tablet Take 1 tablet (600 mg total) by mouth 2 (two) times daily before a meal.   [DISCONTINUED] glimepiride  (AMARYL ) 4 MG tablet TAKE 1 TABLET (4 MG TOTAL) BY MOUTH 2 (TWO) TIMES DAILY.   [DISCONTINUED] glimepiride  (AMARYL ) 4 MG tablet Take 2 mg in am and 4 mg in pm   [DISCONTINUED] insulin  NPH (HUMULIN N) 100 UNIT/ML injection Inject 12 Units into the skin 2 (two) times daily at 8 am and 10 pm.   [DISCONTINUED] metFORMIN  (GLUCOPHAGE ) 500 MG tablet TAKE 2 TABLETS (1,000 MG TOTAL) BY MOUTH 2 (TWO) TIMES DAILY WITH A MEAL.   [DISCONTINUED] metFORMIN  (GLUCOPHAGE ) 500 MG tablet TAKE 2 TABLETS (1,000 MG TOTAL) BY MOUTH 2 (TWO) TIMES DAILY WITH A MEAL.   [DISCONTINUED] metFORMIN  (GLUCOPHAGE -XR) 500 MG 24 hr tablet Take 1 tablet (500 mg total) by mouth daily with breakfast. Metformin  Titration Schedule: Week 1: Take 1 tablet by mouth (500 mg) with breakfast daily. Week 2: Take 2 tablets by mouth (1000 mg) with breakfast daily. Week 3: Take 2 tablets by mouth (1000 mg) with breakfast daily and take 1 tablet by mouth (500 mg) with dinner daily. Week 4: Take  2 tablets by mouth (1000 mg) with breakfast daily and take 2 tablet by mouth (1000 mg) with dinner daily.   No facility-administered encounter medications on file as of 02/21/2024.    Past Medical History:  Diagnosis Date   Allergy to latex    Contact dermatitis and other eczema, due to unspecified cause    Diaphragmatic hernia without mention of obstruction or gangrene    Obesity, unspecified    Other and unspecified ovarian cyst    Regional enteritis of small intestine (HCC)    Tobacco use disorder    Type II or unspecified type diabetes mellitus without mention of complication, not stated as uncontrolled    Unspecified sinusitis (chronic)     Past Surgical History:  Procedure Laterality Date   BLADDER SURGERY     as a child   KNEE SURGERY Right    NASAL SEPTUM SURGERY  TONSILLECTOMY AND ADENOIDECTOMY      Family History  Problem Relation Age of Onset   Stroke Mother    COPD Mother    Asthma Mother    Emphysema Mother    Coronary artery disease Father    Prostate cancer Father     Social History   Socioeconomic History   Marital status: Single    Spouse name: Not on file   Number of children: 0   Years of education: Not on file   Highest education level: Not on file  Occupational History   Not on file  Tobacco Use   Smoking status: Some Days    Current packs/day: 0.00    Types: Cigarettes    Last attempt to quit: 04/24/2008    Years since quitting: 15.8   Smokeless tobacco: Never  Vaping Use   Vaping status: Every Day  Substance and Sexual Activity   Alcohol use: No   Drug use: No   Sexual activity: Never  Other Topics Concern   Not on file  Social History Narrative   Regular exercise: no   Caffeine use: sweet tea, occasionally   Social Drivers of Corporate Investment Banker Strain: Not on file  Food Insecurity: Not on file  Transportation Needs: Not on file  Physical Activity: Not on file  Stress: Not on file  Social Connections: Not on  file  Intimate Partner Violence: Not on file    ROS  Per HPI      Objective    BP 132/85   Pulse 100   Temp 98.5 F (36.9 C) (Temporal)   Resp 18   Ht 5' 6.14 (1.68 m)   Wt 160 lb 14.4 oz (73 kg)   LMP  (LMP Unknown)   SpO2 98%   BMI 25.86 kg/m   Physical Exam Constitutional:      General: She is not in acute distress.    Appearance: Normal appearance. She is not ill-appearing.  HENT:     Head: Normocephalic and atraumatic.     Right Ear: External ear normal.     Left Ear: External ear normal.     Nose: Nose normal.     Mouth/Throat:     Mouth: Mucous membranes are moist.     Pharynx: Oropharynx is clear.  Eyes:     General: No scleral icterus.    Extraocular Movements: Extraocular movements intact.     Conjunctiva/sclera: Conjunctivae normal.     Pupils: Pupils are equal, round, and reactive to light.  Neck:     Vascular: No carotid bruit.  Cardiovascular:     Rate and Rhythm: Normal rate and regular rhythm.     Pulses: Normal pulses.     Heart sounds: Normal heart sounds. No murmur heard.    No friction rub.  Pulmonary:     Effort: Pulmonary effort is normal. No respiratory distress.     Breath sounds: Normal breath sounds. No wheezing, rhonchi or rales.  Musculoskeletal:        General: Normal range of motion.     Cervical back: Neck supple.     Right lower leg: No edema.     Left lower leg: No edema.  Skin:    General: Skin is warm and dry.     Coloration: Skin is not jaundiced or pale.  Neurological:     General: No focal deficit present.     Mental Status: She is alert.     Deep Tendon Reflexes: Reflexes  normal.  Psychiatric:        Mood and Affect: Mood normal.        Behavior: Behavior normal.         Assessment & Plan:   Encounter to establish care  Uncontrolled type 2 diabetes mellitus with hyperglycemia (HCC) Assessment & Plan: Long-standing uncontrolled type 2 diabetes mellitus with an A1c of 11%. Current symptoms include  blurred vision and a history of severe headaches, though no current headaches or frequent urination. She has been off diabetes medications and has poor dietary habits, including sugary drinks. The goal is to lower blood glucose levels to facilitate cataract surgery. - Initiate glargine insulin  at 10 units daily, titrate by 3 units every 2-3 days if fasting blood glucose remains above 150 mg/dL, not to exceed 25 units without consultation. - Restart metformin , titrate weekly starting with one tablet, increasing to four tablets as tolerated. - Provide a glucose meter for home monitoring of fasting and postprandial blood glucose. - Advise on dietary modifications, emphasizing the reduction of sugary drinks and the use of artificial sweeteners if necessary. - Schedule follow-up in 4 weeks to assess progress and adjust treatment as needed. - Assess basic labwork  Orders: -     POCT glycosylated hemoglobin (Hb A1C) -     Insulin  Glargine-yfgn; Inject 10 Units into the skin daily. Increase by 2 units every 2-3 days until fasting glucose (also called blood sugar) is 130 mg/dL. Do not increase past a daily dose of 25 units daily without contacting me. Do not increase your insulin  dose again if you develop any low blood sugar during the day (<70 mg/dL).  Dispense: 15 mL; Refill: PRN -     CBC with Differential/Platelet -     Comprehensive metabolic panel with GFR -     Microalbumin / creatinine urine ratio -     Blood Glucose Monitoring Suppl; 1 each by Does not apply route as directed. Dispense based on patient and insurance preference. Use up to four times daily as directed. (FOR ICD-10 E10.9, E11.9).  Dispense: 1 each; Refill: 0 -     Blood Glucose Test; 1 each by Does not apply route as directed. Dispense based on patient and insurance preference. Use up to four times daily as directed. (FOR ICD-10 E10.9, E11.9).  Dispense: 100 strip; Refill: PRN -     Lancet Device; 1 each by Does not apply route as  directed. Dispense based on patient and insurance preference. Use up to four times daily as directed. (FOR ICD-10 E10.9, E11.9).  Dispense: 1 each; Refill: 0 -     Lancets; 1 each by Does not apply route as directed. Dispense based on patient and insurance preference. Use up to four times daily as directed. (FOR ICD-10 E10.9, E11.9).  Dispense: 100 each; Refill: PRN -     metFORMIN  HCl ER; Take 1 tablet (500 mg total) by mouth daily with breakfast. Follow the titration schedule: Week 1: Take 1 tablet by mouth (500 mg) with breakfast daily. Week 2: Take 2 tablets by mouth (1000 mg) with breakfast daily. Week 3: Take 2 tablets by mouth (1000 mg) with breakfast daily and take 1 tablet by mouth (500 mg) with dinner daily. Week 4: Take 2 tablets by mouth (1000 mg) with breakfast daily and take 2 tablet by mouth (1000 mg) with dinner daily.  Dispense: 120 tablet; Refill: 6  Crohn's disease of small intestine without complication (HCC) Assessment & Plan: Crohn's disease. No recent follow-up with  a gastroenterologist. - Refer to [facility] in [place] for evaluation and management of Crohn's disease. - Schedule a colonoscopy as part of routine management for Crohn's disease.  Orders: -     Ambulatory referral to Gastroenterology  Encounter for screening mammogram for malignant neoplasm of breast -     3D Screening Mammogram, Left and Right; Future  Encounter for lipid screening for cardiovascular disease -     Lipid panel  Cataract of both eyes, unspecified cataract type Assessment & Plan: Bilateral cataracts causing significant visual impairment, with surgery postponed due to uncontrolled diabetes. The goal is to stabilize blood glucose levels to proceed with cataract surgery. Surgery on November 12th is not feasible due to current blood glucose levels. - Advise postponement of cataract surgery scheduled for November 12th until blood glucose is controlled. - Coordinate with the nurse practitioner  regarding blood glucose management and surgery rescheduling.    General Health Maintenance No recent mammogram or diabetic eye exam reported. Discussed potential need for cholesterol-lowering therapy based on diabetes and cholesterol levels. - Order mammogram and schedule at convenience. - Schedule diabetic eye exam. - Discuss potential need for cholesterol-lowering therapy based on diabetes and cholesterol levels. - Discuss lung cancer screening if indicated in the future.      Return in about 4 weeks (around 03/20/2024) for DM.   Mohsin Crum T Duvan Mousel, PA-C

## 2024-02-21 NOTE — Assessment & Plan Note (Signed)
 Crohn's disease. No recent follow-up with a gastroenterologist. - Refer to [facility] in [place] for evaluation and management of Crohn's disease. - Schedule a colonoscopy as part of routine management for Crohn's disease.

## 2024-02-21 NOTE — Assessment & Plan Note (Addendum)
 Long-standing uncontrolled type 2 diabetes mellitus with an A1c of 11%. Current symptoms include blurred vision and a history of severe headaches, though no current headaches or frequent urination. She has been off diabetes medications and has poor dietary habits, including sugary drinks. The goal is to lower blood glucose levels to facilitate cataract surgery. - Initiate glargine insulin  at 10 units daily, titrate by 3 units every 2-3 days if fasting blood glucose remains above 150 mg/dL, not to exceed 25 units without consultation. - Restart metformin , titrate weekly starting with one tablet, increasing to four tablets as tolerated. - Provide a glucose meter for home monitoring of fasting and postprandial blood glucose. - Advise on dietary modifications, emphasizing the reduction of sugary drinks and the use of artificial sweeteners if necessary. - Schedule follow-up in 4 weeks to assess progress and adjust treatment as needed. - Assess basic labwork

## 2024-02-22 ENCOUNTER — Telehealth (HOSPITAL_BASED_OUTPATIENT_CLINIC_OR_DEPARTMENT_OTHER): Payer: Self-pay | Admitting: Student

## 2024-02-22 ENCOUNTER — Ambulatory Visit (HOSPITAL_BASED_OUTPATIENT_CLINIC_OR_DEPARTMENT_OTHER): Payer: Self-pay | Admitting: Student

## 2024-02-22 DIAGNOSIS — E1169 Type 2 diabetes mellitus with other specified complication: Secondary | ICD-10-CM

## 2024-02-22 LAB — CBC WITH DIFFERENTIAL/PLATELET
Basophils Absolute: 0.1 x10E3/uL (ref 0.0–0.2)
Basos: 1 %
EOS (ABSOLUTE): 0.1 x10E3/uL (ref 0.0–0.4)
Eos: 1 %
Hematocrit: 49.8 % — ABNORMAL HIGH (ref 34.0–46.6)
Hemoglobin: 16.3 g/dL — ABNORMAL HIGH (ref 11.1–15.9)
Immature Grans (Abs): 0 x10E3/uL (ref 0.0–0.1)
Immature Granulocytes: 0 %
Lymphocytes Absolute: 2.6 x10E3/uL (ref 0.7–3.1)
Lymphs: 24 %
MCH: 31.2 pg (ref 26.6–33.0)
MCHC: 32.7 g/dL (ref 31.5–35.7)
MCV: 95 fL (ref 79–97)
Monocytes Absolute: 0.5 x10E3/uL (ref 0.1–0.9)
Monocytes: 4 %
Neutrophils Absolute: 7.8 x10E3/uL — ABNORMAL HIGH (ref 1.4–7.0)
Neutrophils: 70 %
Platelets: 415 x10E3/uL (ref 150–450)
RBC: 5.22 x10E6/uL (ref 3.77–5.28)
RDW: 12.1 % (ref 11.7–15.4)
WBC: 11 x10E3/uL — ABNORMAL HIGH (ref 3.4–10.8)

## 2024-02-22 LAB — COMPREHENSIVE METABOLIC PANEL WITH GFR
ALT: 15 IU/L (ref 0–32)
AST: 10 IU/L (ref 0–40)
Albumin: 4.6 g/dL (ref 3.9–4.9)
Alkaline Phosphatase: 85 IU/L (ref 41–116)
BUN/Creatinine Ratio: 21 (ref 9–23)
BUN: 16 mg/dL (ref 6–24)
Bilirubin Total: 0.4 mg/dL (ref 0.0–1.2)
CO2: 21 mmol/L (ref 20–29)
Calcium: 9.8 mg/dL (ref 8.7–10.2)
Chloride: 92 mmol/L — ABNORMAL LOW (ref 96–106)
Creatinine, Ser: 0.78 mg/dL (ref 0.57–1.00)
Globulin, Total: 2.8 g/dL (ref 1.5–4.5)
Glucose: 380 mg/dL — ABNORMAL HIGH (ref 70–99)
Potassium: 4.4 mmol/L (ref 3.5–5.2)
Sodium: 133 mmol/L — ABNORMAL LOW (ref 134–144)
Total Protein: 7.4 g/dL (ref 6.0–8.5)
eGFR: 92 mL/min/1.73 (ref >59)

## 2024-02-22 LAB — LIPID PANEL
Chol/HDL Ratio: 7.7 ratio — ABNORMAL HIGH (ref 0.0–4.4)
Cholesterol, Total: 262 mg/dL — ABNORMAL HIGH (ref 100–199)
HDL: 34 mg/dL — ABNORMAL LOW (ref >39)
LDL Chol Calc (NIH): 144 mg/dL — ABNORMAL HIGH (ref 0–99)
Triglycerides: 452 mg/dL — ABNORMAL HIGH (ref 0–149)
VLDL Cholesterol Cal: 84 mg/dL — ABNORMAL HIGH (ref 5–40)

## 2024-02-22 LAB — MICROALBUMIN / CREATININE URINE RATIO
Creatinine, Urine: 59.1 mg/dL
Microalb/Creat Ratio: 12 mg/g{creat} (ref 0–29)
Microalbumin, Urine: 6.8 ug/mL

## 2024-02-22 NOTE — Telephone Encounter (Signed)
 Contacted pharmacy and they were able to change this to lantus, stated insurance did not cove the semglee. Routing to PCP as an FYI

## 2024-02-22 NOTE — Telephone Encounter (Signed)
 Copied from CRM #[id number]. Topic: Clinical - Prescription Issue >> Feb 22, 2024  8:42 AM Adrienne Goodwin wrote: Reason for CRM: Patient states that the pharmacy contacted her in regards to the prescription for insulin  glargine-yfgn (SEMGLEE) 100 UNIT/ML Pen, they advised her that the insurance company denied the prescription. (Asked if it needed prior auth, she said she didn't think so they just denied it) Patient would like to know what she needs to do from here. Trying to get her sugar down for cataract surgery. Going out of town at noon, would like a call prior if possible.  Patient can be reached at [phone]

## 2024-02-26 MED ORDER — ATORVASTATIN CALCIUM 20 MG PO TABS: 20.0000 mg | ORAL_TABLET | Freq: Every day | ORAL | 6 refills | Status: AC

## 2024-03-11 ENCOUNTER — Encounter (HOSPITAL_BASED_OUTPATIENT_CLINIC_OR_DEPARTMENT_OTHER): Payer: Self-pay

## 2024-03-18 ENCOUNTER — Encounter (HOSPITAL_BASED_OUTPATIENT_CLINIC_OR_DEPARTMENT_OTHER): Payer: Self-pay | Admitting: Student

## 2024-03-18 ENCOUNTER — Telehealth (HOSPITAL_BASED_OUTPATIENT_CLINIC_OR_DEPARTMENT_OTHER): Payer: Self-pay

## 2024-03-18 ENCOUNTER — Ambulatory Visit (INDEPENDENT_AMBULATORY_CARE_PROVIDER_SITE_OTHER): Admitting: Student

## 2024-03-18 VITALS — BP 136/87 | HR 81 | Temp 98.3°F | Resp 16 | Ht 66.14 in | Wt 167.7 lb

## 2024-03-18 DIAGNOSIS — E1165 Type 2 diabetes mellitus with hyperglycemia: Secondary | ICD-10-CM | POA: Diagnosis not present

## 2024-03-18 DIAGNOSIS — Z7984 Long term (current) use of oral hypoglycemic drugs: Secondary | ICD-10-CM | POA: Diagnosis not present

## 2024-03-18 LAB — POCT GLUCOSE (DEVICE FOR HOME USE): POC Glucose: 220 mg/dL — AB (ref 70–99)

## 2024-03-18 MED ORDER — DEXCOM G7 SENSOR MISC: 6 refills | Status: AC

## 2024-03-18 MED ORDER — DEXCOM G7 RECEIVER DEVI: 0 refills | Status: AC

## 2024-03-18 NOTE — Progress Notes (Signed)
 Established Patient Office Visit  Subjective   Patient ID: Adrienne Goodwin, female    DOB: November 13, 1975  Age: 48 y.o. MRN: [id number]  Chief Complaint  Patient presents with   Medical Management of Chronic Issues    Follow up.   URI    Has had a cold over 1 month. Taking generic dayquil tablets and dayquil liquid. Also trying delsym.     HPI  Discussed the use of AI scribe software for clinical note transcription with the patient, who gave verbal consent to proceed.  History of Present Illness   Adrienne Goodwin is a 48 year old female with diabetes who presents for follow-up on blood sugar management and sinus issues.  She has been experiencing a persistent cold for over a month, characterized by a runny nose and occasional coughing. Over-the-counter medications including DayQuil, Mucinex DM in the morning, and Delsym at night have been used to manage these symptoms. She suspects allergies may be contributing to her symptoms and plans to start taking Zyrtec nightly. She had nasal surgery in 1991 to address small nasal passages and adenoid removal.  Her blood sugar levels have been improving since starting insulin  and metformin . She is currently on 22 units of insulin  and four tablets of metformin  daily. Fasting blood sugar levels have been decreasing, with recent readings of 93 and 133. She started insulin  on November 1st and metformin  on December 31st. She has been monitoring her blood sugar levels twice daily, with better control noted in the mornings.  She has made significant dietary changes, including eliminating Coke from her diet for over two months and drinking water with sugar-free flavor packets. She is attempting to reduce her intake of carbohydrates such as bread, rice, pasta, potatoes, and pizza. She had a Pop-Tart for breakfast today and has been taking her medications, including metformin  and aspirin, in the morning.  She is interested in using a continuous glucose monitor to  avoid frequent finger sticks. Her insurance is now fully active, and she has been managing her medication costs with [facility]. She is also taking Lipitor at night to avoid muscle pain, which she experienced initially but has since resolved.  She has a history of Crohn's disease, diagnosed via colonoscopy. No history of sleep apnea. No chills or sweats reported.      Patient Active Problem List   Diagnosis Date Noted   Cataract of both eyes 02/21/2024   Abnormal ECG 02/11/2013   Exertional dyspnea 02/11/2013   Physical exam, annual 01/14/2013   Hyperlipidemia 01/12/2011   Diaphragmatic hernia 06/20/2010   OVARIAN CYST 06/20/2010   Uncontrolled type 2 diabetes mellitus with hyperglycemia (HCC) 06/14/2010   OBESITY 06/14/2010   CIGARETTE SMOKER 06/14/2010   CROHN'S DISEASE, SMALL INTESTINE 06/14/2010   DERMATITIS 06/14/2010   PERSONAL HISTORY OF ALLERGY TO LATEX 06/14/2010   Past Medical History:  Diagnosis Date   Allergy to latex    Contact dermatitis and other eczema, due to unspecified cause    Diaphragmatic hernia without mention of obstruction or gangrene    Obesity, unspecified    Other and unspecified ovarian cyst    Regional enteritis of small intestine (HCC)    Tobacco use disorder    Type II or unspecified type diabetes mellitus without mention of complication, not stated as uncontrolled    Unspecified sinusitis (chronic)    Past Surgical History:  Procedure Laterality Date   BLADDER SURGERY     as a child   KNEE SURGERY Right  NASAL SEPTUM SURGERY     TONSILLECTOMY AND ADENOIDECTOMY     Social History   Tobacco Use   Smoking status: Some Days    Current packs/day: 0.25    Average packs/day: 0.3 packs/day for 15.9 years (4.0 ttl pk-yrs)    Types: Cigarettes    Start date: 04/24/2008   Smokeless tobacco: Never  Vaping Use   Vaping status: Every Day  Substance Use Topics   Alcohol use: No   Drug use: No   Allergies  Allergen Reactions   Latex      REACTION: rash      ROS Per HPI.    Objective:     BP 136/87   Pulse 81   Temp 98.3 F (36.8 C) (Oral)   Resp 16   Ht 5' 6.14 (1.68 m)   Wt 167 lb 11.2 oz (76.1 kg)   LMP  (LMP Unknown)   SpO2 96%   BMI 26.95 kg/m  BP Readings from Last 3 Encounters:  03/18/24 136/87  02/21/24 132/85  10/28/13 110/78   Wt Readings from Last 3 Encounters:  03/18/24 167 lb 11.2 oz (76.1 kg)  02/21/24 160 lb 14.4 oz (73 kg)  10/28/13 216 lb 12.8 oz (98.3 kg)   SpO2 Readings from Last 3 Encounters:  03/18/24 96%  02/21/24 98%  10/28/13 97%      Physical Exam Constitutional:      General: She is not in acute distress.    Appearance: Normal appearance. She is not ill-appearing.  HENT:     Head: Normocephalic and atraumatic.     Nose: Nose normal.  Eyes:     General: No scleral icterus.    Conjunctiva/sclera: Conjunctivae normal.  Cardiovascular:     Rate and Rhythm: Normal rate and regular rhythm.     Heart sounds: Normal heart sounds. No murmur heard.    No friction rub.  Pulmonary:     Effort: Pulmonary effort is normal. No respiratory distress.     Breath sounds: Normal breath sounds. No wheezing, rhonchi or rales.  Musculoskeletal:        General: Normal range of motion.     Right foot: No deformity or Charcot foot.     Left foot: No deformity or Charcot foot.  Feet:     Right foot:     Protective Sensation: 9 sites tested.  9 sites sensed.     Skin integrity: Dry skin present. No ulcer, skin breakdown, erythema or warmth.     Toenail Condition: Right toenails are normal.     Left foot:     Protective Sensation: 9 sites tested.  9 sites sensed.     Skin integrity: Callus and dry skin present. No ulcer, skin breakdown, erythema or warmth.     Toenail Condition: Left toenails are normal.     Comments: Normal pulses Skin:    General: Skin is warm and dry.     Coloration: Skin is not jaundiced or pale.  Neurological:     General: No focal deficit present.      Mental Status: She is alert.  Psychiatric:        Mood and Affect: Mood normal.        Behavior: Behavior normal.      Results for orders placed or performed in visit on 03/18/24  POCT Glucose (Device for Home Use)  Result Value Ref Range   Glucose Fasting, POC     POC Glucose 220 (A) 70 - 99 mg/dl  Last CBC Lab Results  Component Value Date   WBC 11.0 (H) 02/21/2024   HGB 16.3 (H) 02/21/2024   HCT 49.8 (H) 02/21/2024   MCV 95 02/21/2024   MCH 31.2 02/21/2024   RDW 12.1 02/21/2024   PLT 415 02/21/2024   Last metabolic panel Lab Results  Component Value Date   GLUCOSE 380 (H) 02/21/2024   NA 133 (L) 02/21/2024   K 4.4 02/21/2024   CL 92 (L) 02/21/2024   CO2 21 02/21/2024   BUN 16 02/21/2024   CREATININE 0.78 02/21/2024   EGFR 92 02/21/2024   CALCIUM  9.8 02/21/2024   PROT 7.4 02/21/2024   ALBUMIN 4.6 02/21/2024   LABGLOB 2.8 02/21/2024   BILITOT 0.4 02/21/2024   ALKPHOS 85 02/21/2024   AST 10 02/21/2024   ALT 15 02/21/2024   Last lipids Lab Results  Component Value Date   CHOL 262 (H) 02/21/2024   HDL 34 (L) 02/21/2024   LDLCALC 144 (H) 02/21/2024   LDLDIRECT 153.1 04/22/2013   TRIG 452 (H) 02/21/2024   CHOLHDL 7.7 (H) 02/21/2024   Last hemoglobin A1c Lab Results  Component Value Date   HGBA1C 11 02/21/2024      The 10-year ASCVD risk score (Arnett DK, et al., 2019) is: 18.8%    Assessment & Plan:   Assessment and Plan    Type 2 diabetes mellitus with hyperglycemia Chronic, relatively stable for now- we will hold on further changes until . Blood glucose levels have improved with current regimen of 22 units of insulin  and 4 tablets of metformin . Morning glucose levels are well-controlled, with recent fasting readings of 93 and 133 mg/dL and major dietary changes. She is interested in continuous glucose monitoring due to frequent finger sticks. Insurance coverage for Dexcom G7 sensor pending. Future consideration for SGLT2 that lowers blood sugar  and provides cardiovascular and renal protection.  - Continue 22 units of basal insulin  and 4 tablets of metformin . - Prescribed Dexcom G7 sensor with receiver for continuous glucose monitoring. - Scheduled lab visit for A1c and lipid panel in early February. - Continue working with eye doctor for diabetic eye exams. - Contact eye doctor to confirm clearance for cataract surgery.  Allergic rhinitis Persistent sinus issues for over a month, likely due to allergies. Previous nasal surgery in 1991. Current symptoms include runny nose and occasional cough. No current use of allergy medications, but Zyrtec and Flonase are available at home. - Start Zyrtec nightly. - Start Flonase twice daily for two weeks, then reduce to once daily.  Hyperlipidemia Chronic, stable. Currently taking Lipitor at night as recommended by pharmacist to minimize muscle pain. No significant muscle aches or joint pains reported. CoQ10 suggested as a supplement to alleviate potential statin-associated muscle aches. - Continue Lipitor at night. - Consider CoQ10 supplement if muscle aches occur.      I personally spent a total of 35 minutes in the care of the patient today including preparing to see the patient, getting/reviewing separately obtained history, performing a medically appropriate exam/evaluation, counseling and educating, placing orders, and documenting clinical information in the EHR.   Return in about 2 months (around 05/26/2024) for DM.    Zaevion Parke T Eldrige Pitkin, PA-C

## 2024-03-18 NOTE — Patient Instructions (Addendum)
 It was nice to see you today!  As we discussed in clinic: - Start zyrtec nightly - Start flonase twice daily for 2 weeks and then once daily thereafter - Start CoQ-10 for muscle aches alongside the statin if needed.  If you have any problems before your next visit feel free to message me via MyChart (minor issues or questions) or call the office, otherwise you may reach out to schedule an office visit.  Thank you! Sueellen Kayes, PA-C

## 2024-03-18 NOTE — Telephone Encounter (Signed)
 LMTCB regarding a form pt stated we needed to sign. Left VM for pt.

## 2024-05-26 ENCOUNTER — Ambulatory Visit (HOSPITAL_BASED_OUTPATIENT_CLINIC_OR_DEPARTMENT_OTHER): Admitting: Family Medicine

## 2024-05-28 ENCOUNTER — Ambulatory Visit (INDEPENDENT_AMBULATORY_CARE_PROVIDER_SITE_OTHER): Admitting: Family Medicine

## 2024-05-28 ENCOUNTER — Encounter (HOSPITAL_BASED_OUTPATIENT_CLINIC_OR_DEPARTMENT_OTHER): Payer: Self-pay | Admitting: Family Medicine

## 2024-05-28 VITALS — BP 131/89 | HR 99 | Temp 98.4°F | Resp 17 | Wt 173.7 lb

## 2024-05-28 DIAGNOSIS — E785 Hyperlipidemia, unspecified: Secondary | ICD-10-CM | POA: Diagnosis not present

## 2024-05-28 DIAGNOSIS — E1165 Type 2 diabetes mellitus with hyperglycemia: Secondary | ICD-10-CM

## 2024-05-28 DIAGNOSIS — Z794 Long term (current) use of insulin: Secondary | ICD-10-CM

## 2024-05-28 DIAGNOSIS — Z720 Tobacco use: Secondary | ICD-10-CM

## 2024-05-28 NOTE — Progress Notes (Signed)
 "  Established Patient Office Visit  Subjective   Patient ID: Karee Forge, female    DOB: 1976/02/04  Age: 49 y.o. MRN: [id number]  Chief Complaint  Patient presents with   Follow-up    Follow-up    Discussed the use of AI scribe software for clinical note transcription with the patient, who gave verbal consent to proceed.  History of Present Illness Lynnett Langlinais is a 49 year old female with type 2 diabetes who presents for a follow-up and blood work.  Usually sees Jacob Rothfuss, [place].  She is managing her type 2 diabetes with metformin , taking two tablets in the morning and two at night, and insulin  therapy with Lantus  at 22 units. She has not been on any other diabetes medications such as Jardiance, etc in the past due to compliance issues and financial concerns. She has fasted, except for a couple of sips of water, in preparation for the blood work.  She has a history of cataracts and has undergone surgery to correct them. She has been released to resume normal activities and is now looking for a job after putting her job search on hold for the surgeries.  She has not been to a doctor for ten years prior to her recent visits, due to distrust in local healthcare providers and the passing of her mother. During this time, she was not managing her diabetes, which has led to her current high insulin  requirements.  She confirms that she is taking her cholesterol medication regularly. No issues with her feet and has had a foot exam during her last visit.  She has received dietary education and is currently paying for her insurance out of pocket as she is unemployed.    Past Medical History:  Diagnosis Date   Allergy to latex    Contact dermatitis and other eczema, due to unspecified cause    Diaphragmatic hernia without mention of obstruction or gangrene    Obesity, unspecified    Other and unspecified ovarian cyst    Regional enteritis of small intestine (HCC)    Tobacco use  disorder    Type II or unspecified type diabetes mellitus without mention of complication, not stated as uncontrolled    Unspecified sinusitis (chronic)     Outpatient Encounter Medications as of 05/28/2024  Medication Sig   ACCU-CHEK GUIDE TEST test strip    aspirin EC 81 MG tablet Take 1 tablet (81 mg total) by mouth daily.   atorvastatin  (LIPITOR) 20 MG tablet Take 1 tablet (20 mg total) by mouth daily.   B-D INS SYRINGE 0.5CC/31GX5/16 31G X 5/16 0.5 ML MISC USE AS DIRECTED   Blood Glucose Monitoring Suppl DEVI 1 each by Does not apply route as directed. Dispense based on patient and insurance preference. Use up to four times daily as directed. (FOR ICD-10 E10.9, E11.9).   Continuous Glucose Receiver (DEXCOM G7 RECEIVER) DEVI Use to check glucose alongside the dexcom G7 Sensor.   Continuous Glucose Sensor (DEXCOM G7 SENSOR) MISC Apply to upper arm every 10 days. Use alongside dexcom G7 receiver.   Dextromethorphan HBr (DELSYM PO) Take by mouth as needed.   DM-Doxylamine-Acetaminophen (NIGHT-TIME COLD/FLU RELIEF PO) Take by mouth.   Glucose Blood (BLOOD GLUCOSE TEST STRIPS) STRP 1 each by Does not apply route as directed. Dispense based on patient and insurance preference. Use up to four times daily as directed. (FOR ICD-10 E10.9, E11.9).   guaiFENesin (MUCINEX) 600 MG 12 hr tablet Take 1,200 mg by mouth 2 (two)  times daily as needed.    Lancet Device MISC 1 each by Does not apply route as directed. Dispense based on patient and insurance preference. Use up to four times daily as directed. (FOR ICD-10 E10.9, E11.9).   Lancets MISC 1 each by Does not apply route as directed. Dispense based on patient and insurance preference. Use up to four times daily as directed. (FOR ICD-10 E10.9, E11.9).   [DISCONTINUED] insulin  glargine-yfgn (SEMGLEE ) 100 UNIT/ML Pen Inject 10 Units into the skin daily. Increase by 2 units every 2-3 days until fasting glucose (also called blood sugar) is 130 mg/dL. Do not  increase past a daily dose of 25 units daily without contacting me. Do not increase your insulin  dose again if you develop any low blood sugar during the day (<70 mg/dL).   LANTUS  SOLOSTAR 100 UNIT/ML Solostar Pen INJECT 10 UNITS EVERY DAY .SABRA INCREASE BY 2 UNITS EVERY 2-3 DAYS UNTIL FASTING GLUCOSE 130MG /DL. DO NOT INCREASE PAST 25 UNITS PER DAY (Patient taking differently: Inject 22 Units into the skin daily.)   metFORMIN  (GLUCOPHAGE -XR) 500 MG 24 hr tablet Take 1 tablet (500 mg total) by mouth daily with breakfast. Follow the titration schedule: Week 1: Take 1 tablet by mouth (500 mg) with breakfast daily. Week 2: Take 2 tablets by mouth (1000 mg) with breakfast daily. Week 3: Take 2 tablets by mouth (1000 mg) with breakfast daily and take 1 tablet by mouth (500 mg) with dinner daily. Week 4: Take 2 tablets by mouth (1000 mg) with breakfast daily and take 2 tablet by mouth (1000 mg) with dinner daily.   Pseudoephedrine-APAP-DM (DAYQUIL PO) Take 2 tablets by mouth daily. GENERIC   No facility-administered encounter medications on file as of 05/28/2024.    Social History   Tobacco Use   Smoking status: Some Days    Current packs/day: 0.25    Average packs/day: 0.3 packs/day for 16.1 years (4.0 ttl pk-yrs)    Types: Cigarettes    Start date: 04/24/2008   Smokeless tobacco: Never  Vaping Use   Vaping status: Every Day  Substance Use Topics   Alcohol use: No   Drug use: No      Review of Systems  Constitutional:  Negative for diaphoresis, fever, malaise/fatigue and weight loss.  Respiratory:  Negative for cough, shortness of breath and wheezing.   Cardiovascular:  Negative for chest pain, palpitations, orthopnea, claudication, leg swelling and PND.      Objective:     BP 131/89 (Cuff Size: Normal)   Pulse 99   Temp 98.4 F (36.9 C) (Oral)   Resp 17   Wt 173 lb 11.2 oz (78.8 kg)   LMP  (LMP Unknown)   SpO2 97%   BMI 27.92 kg/m    Physical Exam Constitutional:      General:  She is not in acute distress.    Appearance: Normal appearance.  HENT:     Head: Normocephalic.  Cardiovascular:     Rate and Rhythm: Normal rate and regular rhythm.     Pulses: Normal pulses.     Heart sounds: Normal heart sounds.  Pulmonary:     Effort: Pulmonary effort is normal.     Breath sounds: Normal breath sounds.  Abdominal:     General: Bowel sounds are normal.     Palpations: Abdomen is soft.  Musculoskeletal:     Cervical back: Neck supple. No tenderness.     Right lower leg: No edema.     Left lower leg:  No edema.  Neurological:     Mental Status: She is alert.      No results found for any visits on 05/28/24.    The 10-year ASCVD risk score (Arnett DK, et al., 2019) is: 17.5%    Assessment & Plan:   Assessment & Plan Type 2 diabetes mellitus with hyperglycemia, with long-term current use of insulin  (HCC) Long-standing type 2 diabetes mellitus with recent initiation of insulin  therapy due to lack of treatment for ten years. Currently on high-dose insulin  (Lantus ) due to poor glycemic control. No current use of other diabetes medications due to insurance constraints and financial considerations. She is aware of potential benefits of newer medications like Jardiance or Ozempic but prefers to maintain current regimen until insurance situation improves. - Continue Lantus  insulin  regimen. - Ordered blood work to assess current glycemic control. - Discussed potential future use of newer diabetes medications if insurance coverage improves. - She will f/u with [facility] in the coming 2-3 months or sooner prn. Orders:   Hemoglobin A1c   CBC with Differential/Platelet   Comprehensive metabolic panel with GFR  Dyslipidemia Continue Atorvastatin .  Blood work planned to assess current lipid levels. - Ordered blood work to assess lipid levels. Orders:   Lipid panel  Tobacco abuse Urged her to stop.  She has no current interest in Chantix, etc to assist with this.         Return in about 3 months (around 08/25/2024) for chronic follow-up.    REDDING PONCE NORLEEN FALCON., MD  "

## 2024-05-29 ENCOUNTER — Ambulatory Visit (HOSPITAL_BASED_OUTPATIENT_CLINIC_OR_DEPARTMENT_OTHER): Payer: Self-pay | Admitting: Family Medicine

## 2024-05-29 LAB — LIPID PANEL
Chol/HDL Ratio: 4.4 ratio (ref 0.0–4.4)
Cholesterol, Total: 181 mg/dL (ref 100–199)
HDL: 41 mg/dL
LDL Chol Calc (NIH): 102 mg/dL — ABNORMAL HIGH (ref 0–99)
Triglycerides: 221 mg/dL — ABNORMAL HIGH (ref 0–149)
VLDL Cholesterol Cal: 38 mg/dL (ref 5–40)

## 2024-05-29 LAB — COMPREHENSIVE METABOLIC PANEL WITH GFR
ALT: 20 [IU]/L (ref 0–32)
AST: 13 [IU]/L (ref 0–40)
Albumin: 4.7 g/dL (ref 3.9–4.9)
Alkaline Phosphatase: 77 [IU]/L (ref 41–116)
BUN/Creatinine Ratio: 13 (ref 9–23)
BUN: 10 mg/dL (ref 6–24)
Bilirubin Total: 0.5 mg/dL (ref 0.0–1.2)
CO2: 22 mmol/L (ref 20–29)
Calcium: 10 mg/dL (ref 8.7–10.2)
Chloride: 97 mmol/L (ref 96–106)
Creatinine, Ser: 0.75 mg/dL (ref 0.57–1.00)
Globulin, Total: 2.9 g/dL (ref 1.5–4.5)
Glucose: 191 mg/dL — ABNORMAL HIGH (ref 70–99)
Potassium: 4.4 mmol/L (ref 3.5–5.2)
Sodium: 140 mmol/L (ref 134–144)
Total Protein: 7.6 g/dL (ref 6.0–8.5)
eGFR: 97 mL/min/{1.73_m2}

## 2024-05-29 LAB — HEMOGLOBIN A1C
Est. average glucose Bld gHb Est-mCnc: 160 mg/dL
Hgb A1c MFr Bld: 7.2 % — ABNORMAL HIGH (ref 4.8–5.6)

## 2024-05-29 LAB — CBC WITH DIFFERENTIAL/PLATELET
Basophils Absolute: 0.1 10*3/uL (ref 0.0–0.2)
Basos: 1 %
EOS (ABSOLUTE): 0.2 10*3/uL (ref 0.0–0.4)
Eos: 1 %
Hematocrit: 47.4 % — ABNORMAL HIGH (ref 34.0–46.6)
Hemoglobin: 15.8 g/dL (ref 11.1–15.9)
Immature Grans (Abs): 0 10*3/uL (ref 0.0–0.1)
Immature Granulocytes: 0 %
Lymphocytes Absolute: 3.3 10*3/uL — ABNORMAL HIGH (ref 0.7–3.1)
Lymphs: 31 %
MCH: 31.6 pg (ref 26.6–33.0)
MCHC: 33.3 g/dL (ref 31.5–35.7)
MCV: 95 fL (ref 79–97)
Monocytes Absolute: 0.5 10*3/uL (ref 0.1–0.9)
Monocytes: 5 %
Neutrophils Absolute: 6.5 10*3/uL (ref 1.4–7.0)
Neutrophils: 62 %
Platelets: 420 10*3/uL (ref 150–450)
RBC: 5 x10E6/uL (ref 3.77–5.28)
RDW: 12.5 % (ref 11.7–15.4)
WBC: 10.5 10*3/uL (ref 3.4–10.8)

## 2024-08-25 ENCOUNTER — Ambulatory Visit (HOSPITAL_BASED_OUTPATIENT_CLINIC_OR_DEPARTMENT_OTHER): Admitting: Student
